# Patient Record
Sex: Male | Born: 1975 | Race: White | Hispanic: No | Marital: Married | Smoking: Former smoker
Health system: Southern US, Community
[De-identification: ages and names within clinical notes are randomized; demographics above are authoritative.]

## PROBLEM LIST (undated history)

## (undated) DIAGNOSIS — J45909 Unspecified asthma, uncomplicated: Secondary | ICD-10-CM

## (undated) DIAGNOSIS — G43909 Migraine, unspecified, not intractable, without status migrainosus: Secondary | ICD-10-CM

## (undated) DIAGNOSIS — J349 Unspecified disorder of nose and nasal sinuses: Secondary | ICD-10-CM

## (undated) HISTORY — DX: Migraine, unspecified, not intractable, without status migrainosus: G43.909

## (undated) HISTORY — PX: WRIST GANGLION EXCISION: SUR520

## (undated) HISTORY — DX: Unspecified asthma, uncomplicated: J45.909

## (undated) HISTORY — DX: Unspecified disorder of nose and nasal sinuses: J34.9

## (undated) HISTORY — PX: KNEE ARTHROSCOPY: SUR90

---

## 2008-06-30 ENCOUNTER — Emergency Department: Payer: Self-pay | Admitting: Unknown Physician Specialty

## 2009-06-25 ENCOUNTER — Ambulatory Visit: Payer: Self-pay | Admitting: Specialist

## 2011-05-19 ENCOUNTER — Ambulatory Visit: Payer: Self-pay | Admitting: Physical Medicine and Rehabilitation

## 2013-03-28 ENCOUNTER — Telehealth: Payer: Self-pay | Admitting: Internal Medicine

## 2013-03-28 NOTE — Telephone Encounter (Signed)
Pt called stating he was a bmp of your and wanted to switch back to you.  Your new pt appointment are in august and pt wanted to know if he could be worked in for sinus problems sooner

## 2013-03-28 NOTE — Telephone Encounter (Signed)
Yes, fine to work in slot.

## 2013-03-29 NOTE — Telephone Encounter (Signed)
Left message for pt to call office  Mailed new patient packet with appointment

## 2013-05-12 ENCOUNTER — Ambulatory Visit: Payer: Self-pay | Admitting: Internal Medicine

## 2013-08-15 ENCOUNTER — Ambulatory Visit: Payer: Self-pay | Admitting: Internal Medicine

## 2013-08-25 ENCOUNTER — Encounter: Payer: Self-pay | Admitting: Internal Medicine

## 2013-08-25 ENCOUNTER — Ambulatory Visit (INDEPENDENT_AMBULATORY_CARE_PROVIDER_SITE_OTHER): Payer: BC Managed Care – PPO | Admitting: Internal Medicine

## 2013-08-25 VITALS — BP 140/90 | HR 95 | Temp 98.5°F | Ht 71.0 in | Wt 205.0 lb

## 2013-08-25 DIAGNOSIS — Z0000 Encounter for general adult medical examination without abnormal findings: Secondary | ICD-10-CM | POA: Insufficient documentation

## 2013-08-25 DIAGNOSIS — A630 Anogenital (venereal) warts: Secondary | ICD-10-CM

## 2013-08-25 DIAGNOSIS — J309 Allergic rhinitis, unspecified: Secondary | ICD-10-CM

## 2013-08-25 MED ORDER — IMIQUIMOD 5 % EX CREA: TOPICAL_CREAM | CUTANEOUS | Status: DC

## 2013-08-25 MED ORDER — FLUTICASONE PROPIONATE 50 MCG/ACT NA SUSP: 2.0000 | Freq: Every day | NASAL | Status: AC

## 2013-08-25 NOTE — Progress Notes (Signed)
  Subjective:    Patient ID: Christian Santiago, male    DOB: [birth year], 37 y.o.   MRN: [id number]  HPI 37YO male with h/o seasonal allergies presents for annual exam and to re-establish care. He was last seen in our former office 2-3 years ago. Generally doing well. Working as a Copywriter, advertising. Went through a divorce, and learned his wife was cheating on him. Now has genital lesions he is concerned are genital warts. They are not painful. No urethral discharge or vesicular lesions.  Trying to follow healthy diet and exercise. Very active at work. Trying to quit smoking using e-cig.  Outpatient Encounter Prescriptions as of 08/25/2013  Medication Sig Dispense Refill  . loratadine (CLARITIN) 10 MG tablet Take 10 mg by mouth daily.      . fluticasone (FLONASE) 50 MCG/ACT nasal spray Place 2 sprays into the nose daily.  16 g  6   No facility-administered encounter medications on file as of 08/25/2013.    Review of Systems  Constitutional: Negative for fever, chills, activity change, appetite change, fatigue and unexpected weight change.  Eyes: Negative for visual disturbance.  Respiratory: Negative for cough and shortness of breath.   Cardiovascular: Negative for chest pain, palpitations and leg swelling.  Gastrointestinal: Negative for abdominal pain and abdominal distention.  Genitourinary: Positive for genital sores. Negative for dysuria, urgency and difficulty urinating.  Musculoskeletal: Negative for arthralgias and gait problem.  Skin: Negative for color change and rash.  Hematological: Negative for adenopathy.  Psychiatric/Behavioral: Negative for sleep disturbance and dysphoric mood. The patient is not nervous/anxious.        Objective:   Physical Exam  Constitutional: He is oriented to person, place, and time. He appears well-developed and well-nourished. No distress.  HENT:  Head: Normocephalic and atraumatic.  Right Ear: External ear normal.  Left Ear: External ear normal.  Nose: Nose  normal.  Mouth/Throat: Oropharynx is clear and moist. No oropharyngeal exudate.  Eyes: Conjunctivae and EOM are normal. Pupils are equal, round, and reactive to light. Right eye exhibits no discharge. Left eye exhibits no discharge. No scleral icterus.  Neck: Normal range of motion. Neck supple. No tracheal deviation present. No thyromegaly present.  Cardiovascular: Normal rate, regular rhythm and normal heart sounds.  Exam reveals no gallop and no friction rub.   No murmur heard. Pulmonary/Chest: Effort normal and breath sounds normal. No respiratory distress. He has no wheezes. He has no rales. He exhibits no tenderness.  Abdominal: Soft. Bowel sounds are normal. He exhibits no distension. There is no tenderness. There is no rebound.  Genitourinary:     Musculoskeletal: Normal range of motion. He exhibits no edema.  Lymphadenopathy:    He has no cervical adenopathy.  Neurological: He is alert and oriented to person, place, and time. No cranial nerve deficit. Coordination normal.  Skin: Skin is warm and dry. No rash noted. He is not diaphoretic. No erythema. No pallor.  Psychiatric: He has a normal mood and affect. His behavior is normal. Judgment and thought content normal.          Assessment & Plan:

## 2013-08-25 NOTE — Assessment & Plan Note (Signed)
General medical exam normal today. Encouraged continued efforts to quit smoking. Pt declines flu vaccine. Encouraged healthy diet and regular physical activity. Will check labs including CBC, CMP, lipids fasting next week. Recheck BP when pt comes for labs.

## 2013-08-25 NOTE — Assessment & Plan Note (Signed)
Recent genital warts. Will treat with Aldara topically 3x per week. We discussed that resolution may take 16 weeks or more. We also discussed potential treatment with laser therapy if Aldara does not work. Follow up prn.

## 2013-08-25 NOTE — Assessment & Plan Note (Signed)
Symptoms well controlled with Claritin and Flonase. Will continue.

## 2014-08-07 ENCOUNTER — Ambulatory Visit (INDEPENDENT_AMBULATORY_CARE_PROVIDER_SITE_OTHER): Payer: BC Managed Care – PPO | Admitting: Internal Medicine

## 2014-08-07 ENCOUNTER — Encounter: Payer: Self-pay | Admitting: Internal Medicine

## 2014-08-07 VITALS — BP 120/72 | HR 86 | Temp 98.0°F | Ht 71.0 in | Wt 196.2 lb

## 2014-08-07 DIAGNOSIS — L259 Unspecified contact dermatitis, unspecified cause: Secondary | ICD-10-CM

## 2014-08-07 HISTORY — DX: Unspecified contact dermatitis, unspecified cause: L25.9

## 2014-08-07 MED ORDER — TRIAMCINOLONE ACETONIDE 0.5 % EX OINT: 1.0000 "application " | TOPICAL_OINTMENT | Freq: Two times a day (BID) | CUTANEOUS | Status: DC

## 2014-08-07 MED ORDER — PREDNISONE 10 MG PO TABS: ORAL_TABLET | ORAL | Status: DC

## 2014-08-07 NOTE — Assessment & Plan Note (Signed)
Symptoms and exam c/w contact dermatitis from poison oak. Will start oral prednisone given widespread skin involvement and will use prn topical triamcinolone. Follow up prn.

## 2014-08-07 NOTE — Progress Notes (Signed)
   Subjective:    Patient ID: Christian Santiago, male    DOB: 28-Nov-1975, 38 y.o.   MRN: [id number]  HPI 38YO male presents for acute visit.  Rash developed on Friday. Using OTC topical treatments and benadryl with no improvement. Had been working out in the yard. Typical of previous exposure to poison oak. Rash is now spread over trunk, neck, arms and legs. No fever or chills.  Review of Systems  Constitutional: Negative for fever, chills, activity change, appetite change, fatigue and unexpected weight change.  Eyes: Negative for visual disturbance.  Respiratory: Negative for cough and shortness of breath.   Cardiovascular: Negative for chest pain, palpitations and leg swelling.  Gastrointestinal: Negative for abdominal pain and abdominal distention.  Genitourinary: Negative for dysuria, urgency and difficulty urinating.  Musculoskeletal: Negative for arthralgias and gait problem.  Skin: Positive for color change and rash.  Hematological: Negative for adenopathy.  Psychiatric/Behavioral: Positive for sleep disturbance. Negative for dysphoric mood. The patient is not nervous/anxious.        Objective:    BP 120/72  Pulse 86  Temp(Src) 98 F (36.7 C) (Oral)  Ht  (1.803 m)  Wt 196 lb 4 oz (89.018 kg)  BMI 27.38 kg/m2  SpO2 98% Physical Exam  Constitutional: He is oriented to person, place, and time. He appears well-developed and well-nourished. No distress.  HENT:  Head: Normocephalic.  Eyes: Conjunctivae and EOM are normal. Pupils are equal, round, and reactive to light.  Neck: Normal range of motion.  Pulmonary/Chest: Effort normal.  Musculoskeletal: Normal range of motion.  Neurological: He is alert and oriented to person, place, and time.  Skin: Skin is warm. Rash noted. Rash is vesicular (diffuse over neck, arms, legs, trunk). He is not diaphoretic. There is erythema.  Psychiatric: He has a normal mood and affect. His behavior is normal.          Assessment &  Plan:   Problem List Items Addressed This Visit     Unprioritized   Contact dermatitis - Primary     Symptoms and exam c/w contact dermatitis from poison oak. Will start oral prednisone given widespread skin involvement and will use prn topical triamcinolone. Follow up prn.    Relevant Medications      predniSONE (DELTASONE) tablet      triamcinolone (KENALOG) ointment 0.5%       Return if symptoms worsen or fail to improve.

## 2014-08-07 NOTE — Patient Instructions (Signed)
Start Prednisone taper.  Use Triamcinolone as needed.  Follow up as needed.

## 2014-08-07 NOTE — Progress Notes (Signed)
Pre visit review using our clinic review tool, if applicable. No additional management support is needed unless otherwise documented below in the visit note. 

## 2017-02-02 ENCOUNTER — Encounter: Payer: Self-pay | Admitting: Family

## 2017-04-09 ENCOUNTER — Encounter: Payer: Self-pay | Admitting: Family

## 2017-04-15 ENCOUNTER — Encounter: Payer: Self-pay | Admitting: Family

## 2017-04-22 ENCOUNTER — Ambulatory Visit (INDEPENDENT_AMBULATORY_CARE_PROVIDER_SITE_OTHER): Payer: BLUE CROSS/BLUE SHIELD | Admitting: Family

## 2017-04-22 ENCOUNTER — Encounter: Payer: Self-pay | Admitting: Family

## 2017-04-22 VITALS — BP 120/66 | HR 59 | Temp 98.2°F | Ht 71.0 in | Wt 185.4 lb

## 2017-04-22 DIAGNOSIS — Z0000 Encounter for general adult medical examination without abnormal findings: Secondary | ICD-10-CM

## 2017-04-22 NOTE — Assessment & Plan Note (Addendum)
Declines prostate exam in the absence of symptoms. Screening labs ordered. Referral to dermatology due to amount of time spent in the sun. Encouraged regular exercise. Advised tetanus vaccine at local pharmacy.

## 2017-04-22 NOTE — Progress Notes (Signed)
Pre visit review using our clinic review tool, if applicable. No additional management support is needed unless otherwise documented below in the visit note. 

## 2017-04-22 NOTE — Progress Notes (Signed)
Subjective:    Patient ID: Christian Santiago, male    DOB: July 16, 1976, 41 y.o.   MRN: [id number]  CC: Christian LlanoSam Vitale is a 41 y.o. male who presents today for physical exam.    HPI: Feeling well today. No complaints   Smoking-he is not interested in any medications to aid in quitting.      Colorectal  Cancer Screening: No early family history.  Prostate Cancer Screening: Has been discussed with patient; patient declined following PSA based on risks versus benefits. No prostate cancer family history.   Lung Cancer Screening: No 30 year pack year history and > 55 years.  Immunizations       Tetanus - due        Pneumococcal - declines HIV Screening- Candidate for; declines Labs: Screening labs today. Exercise: Gets regular exercise.  Alcohol use: On weekends.  Smoking/tobacco use: Nonsmoker.  Regular dental exams: UTD Wears seat belt: Yes. Skin: no h/o skin cancer;  A lot sun exposure  HISTORY:  Past Medical History:  Diagnosis Date  . Asthma   . Migraines   . Sinus disease    deviated septum    Past Surgical History:  Procedure Laterality Date  . KNEE ARTHROSCOPY     bilateral, torn meniscus and torn quad  . WRIST GANGLION EXCISION     Family History  Problem Relation Age of Onset  . Thyroid disease Mother   . Hypertension Father   . Prostate cancer Neg Hx       ALLERGIES: Patient has no known allergies.  Current Outpatient Prescriptions on File Prior to Visit  Medication Sig Dispense Refill  . fluticasone (FLONASE) 50 MCG/ACT nasal spray Place 2 sprays into the nose daily. 16 g 6   No current facility-administered medications on file prior to visit.     Social History  Substance Use Topics  . Smoking status: Light Tobacco Smoker  . Smokeless tobacco: Never Used  . Alcohol use Yes     Comment: wine on weekends    Review of Systems  Constitutional: Negative for chills and fever.  HENT: Negative for congestion.   Respiratory: Negative for cough.     Cardiovascular: Negative for chest pain, palpitations and leg swelling.  Gastrointestinal: Negative for diarrhea, nausea and vomiting.  Musculoskeletal: Negative for myalgias.  Skin: Negative for rash.  Neurological: Negative for headaches.  Hematological: Negative for adenopathy.  Psychiatric/Behavioral: Negative for confusion.      Objective:    BP 120/66   Pulse (!) 59   Temp 98.2 F (36.8 C) (Oral)   Ht 5\' 11"  (1.803 m)   Wt 185 lb 6.4 oz (84.1 kg)   SpO2 98%   BMI 25.86 kg/m   BP Readings from Last 3 Encounters:  04/22/17 120/66  08/07/14 120/72  08/25/13 140/90   Wt Readings from Last 3 Encounters:  04/22/17 185 lb 6.4 oz (84.1 kg)  08/07/14 196 lb 4 oz (89 kg)  08/25/13 205 lb (93 kg)    Physical Exam  Constitutional: He appears well-developed and well-nourished.  Neck: No thyroid mass and no thyromegaly present.  Cardiovascular: Regular rhythm and normal heart sounds.   Pulmonary/Chest: Effort normal and breath sounds normal. No respiratory distress. He has no wheezes. He has no rhonchi. He has no rales.  Lymphadenopathy:       Head (right side): No submental, no submandibular, no tonsillar, no preauricular, no posterior auricular and no occipital adenopathy present.       Head (left  side): No submental, no submandibular, no tonsillar, no preauricular, no posterior auricular and no occipital adenopathy present.    He has no cervical adenopathy.    He has no axillary adenopathy.  Neurological: He is alert.  Skin: Skin is warm and dry.  Psychiatric: He has a normal mood and affect. His speech is normal and behavior is normal.  Vitals reviewed.      Assessment & Plan:   Problem List Items Addressed This Visit      Other   Routine general medical examination at a health care facility - Primary    Declines prostate exam in the absence of symptoms. Screening labs ordered. Referral to dermatology due to amount of time spent in the sun. Encouraged regular  exercise. Advised tetanus vaccine at local pharmacy.      Relevant Orders   CBC with Differential/Platelet   Comprehensive metabolic panel   Hemoglobin A1c   Lipid panel   TSH   VITAMIN D 25 Hydroxy (Vit-D Deficiency, Fractures)   Ambulatory referral to Dermatology       I have discontinued Mr. Neer's loratadine, imiquimod, predniSONE, and triamcinolone ointment. I am also having him maintain his fluticasone.   No orders of the defined types were placed in this encounter.   Return precautions given.   Risks, benefits, and alternatives of the medications and treatment plan prescribed today were discussed, and patient expressed understanding.   Education regarding symptom management and diagnosis given to patient on AVS.   Continue to follow with Allegra Grana, FNP for routine health maintenance.   Yahmir Saputo and I agreed with plan.   Rennie Plowman, FNP

## 2017-04-22 NOTE — Patient Instructions (Addendum)
Pleasure meeting you Fasting lab appointment Referral to dermatology   Health Maintenance, Male A healthy lifestyle and preventive care is important for your health and wellness. Ask your health care provider about what schedule of regular examinations is right for you. What should I know about weight and diet?  Eat a Healthy Diet  Eat plenty of vegetables, fruits, whole grains, low-fat dairy products, and lean protein.  Do not eat a lot of foods high in solid fats, added sugars, or salt. Maintain a Healthy Weight  Regular exercise can help you achieve or maintain a healthy weight. You should:  Do at least 150 minutes of exercise each week. The exercise should increase your heart rate and make you sweat (moderate-intensity exercise).  Do strength-training exercises at least twice a week. Watch Your Levels of Cholesterol and Blood Lipids  Have your blood tested for lipids and cholesterol every 5 years starting at 43 years of age. If you are at high risk for heart disease, you should start having your blood tested when you are 43 years old. You may need to have your cholesterol levels checked more often if:  Your lipid or cholesterol levels are high.  You are older than 43 years of age.  You are at high risk for heart disease. What should I know about cancer screening? Many types of cancers can be detected early and may often be prevented. Lung Cancer  You should be screened every year for lung cancer if:  You are a current smoker who has smoked for at least 30 years.  You are a former smoker who has quit within the past 15 years.  Talk to your health care provider about your screening options, when you should start screening, and how often you should be screened. Colorectal Cancer  Routine colorectal cancer screening usually begins at 43 years of age and should be repeated every 5-10 years until you are 43 years old. You may need to be screened more often if early forms of  precancerous polyps or small growths are found. Your health care provider may recommend screening at an earlier age if you have risk factors for colon cancer.  Your health care provider may recommend using home test kits to check for hidden blood in the stool.  A small camera at the end of a tube can be used to examine your colon (sigmoidoscopy or colonoscopy). This checks for the earliest forms of colorectal cancer. Prostate and Testicular Cancer  Depending on your age and overall health, your health care provider may do certain tests to screen for prostate and testicular cancer.  Talk to your health care provider about any symptoms or concerns you have about testicular or prostate cancer. Skin Cancer  Check your skin from head to toe regularly.  Tell your health care provider about any new moles or changes in moles, especially if:  There is a change in a mole's size, shape, or color.  You have a mole that is larger than a pencil eraser.  Always use sunscreen. Apply sunscreen liberally and repeat throughout the day.  Protect yourself by wearing long sleeves, pants, a wide-brimmed hat, and sunglasses when outside. What should I know about heart disease, diabetes, and high blood pressure?  If you are 5618-43 years of age, have your blood pressure checked every 3-5 years. If you are 43 years of age or older, have your blood pressure checked every year. You should have your blood pressure measured twice-once when you are at a  hospital or clinic, and once when you are not at a hospital or clinic. Record the average of the two measurements. To check your blood pressure when you are not at a hospital or clinic, you can use:  An automated blood pressure machine at a pharmacy.  A home blood pressure monitor.  Talk to your health care provider about your target blood pressure.  If you are between 63-51 years old, ask your health care provider if you should take aspirin to prevent heart  disease.  Have regular diabetes screenings by checking your fasting blood sugar level.  If you are at a normal weight and have a low risk for diabetes, have this test once every three years after the age of 45.  If you are overweight and have a high risk for diabetes, consider being tested at a younger age or more often.  A one-time screening for abdominal aortic aneurysm (AAA) by ultrasound is recommended for men aged 65-75 years who are current or former smokers. What should I know about preventing infection? Hepatitis B  If you have a higher risk for hepatitis B, you should be screened for this virus. Talk with your health care provider to find out if you are at risk for hepatitis B infection. Hepatitis C  Blood testing is recommended for:  Everyone born from [birth year] through 1967.  Anyone with known risk factors for hepatitis C. Sexually Transmitted Diseases (STDs)  You should be screened each year for STDs including gonorrhea and chlamydia if:  You are sexually active and are younger than 43 years of age.  You are older than 43 years of age and your health care provider tells you that you are at risk for this type of infection.  Your sexual activity has changed since you were last screened and you are at an increased risk for chlamydia or gonorrhea. Ask your health care provider if you are at risk.  Talk with your health care provider about whether you are at high risk of being infected with HIV. Your health care provider may recommend a prescription medicine to help prevent HIV infection. What else can I do?  Schedule regular health, dental, and eye exams.  Stay current with your vaccines (immunizations).  Do not use any tobacco products, such as cigarettes, chewing tobacco, and e-cigarettes. If you need help quitting, ask your health care provider.  Limit alcohol intake to no more than 2 drinks per day. One drink equals 12 ounces of beer, 5 ounces of wine, or 1 ounces of hard  liquor.  Do not use street drugs.  Do not share needles.  Ask your health care provider for help if you need support or information about quitting drugs.  Tell your health care provider if you often feel depressed.  Tell your health care provider if you have ever been abused or do not feel safe at home. This information is not intended to replace advice given to you by your health care provider. Make sure you discuss any questions you have with your health care provider. Document Released: 05/08/2008 Document Revised: 07/09/2016 Document Reviewed: 08/14/2015 Elsevier Interactive Patient Education  2017 [employer].

## 2017-04-23 ENCOUNTER — Encounter: Payer: Self-pay | Admitting: Family

## 2017-12-28 ENCOUNTER — Ambulatory Visit: Payer: BLUE CROSS/BLUE SHIELD | Admitting: Family Medicine

## 2017-12-28 ENCOUNTER — Encounter: Payer: Self-pay | Admitting: Family Medicine

## 2017-12-28 ENCOUNTER — Other Ambulatory Visit: Payer: Self-pay

## 2017-12-28 VITALS — BP 118/76 | HR 86 | Temp 98.0°F | Wt 203.0 lb

## 2017-12-28 DIAGNOSIS — K529 Noninfective gastroenteritis and colitis, unspecified: Secondary | ICD-10-CM | POA: Insufficient documentation

## 2017-12-28 HISTORY — DX: Noninfective gastroenteritis and colitis, unspecified: K52.9

## 2017-12-28 LAB — COMPREHENSIVE METABOLIC PANEL
ALBUMIN: 4.5 g/dL (ref 3.5–5.2)
ALK PHOS: 69 U/L (ref 39–117)
ALT: 27 U/L (ref 0–53)
AST: 22 U/L (ref 0–37)
BUN: 19 mg/dL (ref 6–23)
CHLORIDE: 103 meq/L (ref 96–112)
CO2: 27 mEq/L (ref 19–32)
CREATININE: 0.87 mg/dL (ref 0.40–1.50)
Calcium: 9.4 mg/dL (ref 8.4–10.5)
GFR: 101.66 mL/min (ref >60.00)
GLUCOSE: 108 mg/dL — AB (ref 70–99)
Potassium: 4.9 mEq/L (ref 3.5–5.1)
SODIUM: 137 meq/L (ref 135–145)
TOTAL PROTEIN: 7.7 g/dL (ref 6.0–8.3)
Total Bilirubin: 0.4 mg/dL (ref 0.2–1.2)

## 2017-12-28 NOTE — Assessment & Plan Note (Signed)
Differential includes gallbladder pathology, gastric pathology, infectious diarrhea, IBS, and food related issues.  Will check stool studies given the length of time this has been going on.  Check CMP to evaluate renal function and electrolytes as well as alkaline phosphatase and LFTs.  Given chronicity we will go ahead and refer to GI.  I discussed the fodmap diet with him as well.  He will cut things out from this diet and see if his symptoms improve.  Return precautions and AVS.

## 2017-12-28 NOTE — Progress Notes (Signed)
  Tommi Rumps, MD Phone: [phone]  Christian Santiago is a 42 y.o. male who presents today for same-day visit.  Patient notes for the last several years he has had issues with abdominal cramping and passing gas 5 minutes after eating his meals.  He will get some right upper quadrant discomfort after eating his meals.  He notes loose stools intermittently with diarrhea at times.  1-2 bowel movements a day.  No blood in stool.  No nausea or vomiting.  Occasionally does have some heartburn that goes away when he eats.  He does note he is lactose intolerant.  No specific foods cause this.  Not drinking out of lakes or streams.  No belching.  No fevers.  Drinks 6-7 beers a week.  No history of colon issues.  No family history of colon issues.  Social History   Tobacco Use  Smoking Status Light Tobacco Smoker  Smokeless Tobacco Never Used     ROS see history of present illness  Objective  Physical Exam Vitals:   12/28/17 0920  BP: 118/76  Pulse: 86  Temp: 98 F (36.7 C)  SpO2: 98%    BP Readings from Last 3 Encounters:  12/28/17 118/76  04/22/17 120/66  08/07/14 120/72   Wt Readings from Last 3 Encounters:  12/28/17 203 lb (92.1 kg)  04/22/17 185 lb 6.4 oz (84.1 kg)  08/07/14 196 lb 4 oz (89 kg)    Physical Exam  Constitutional: No distress.  Cardiovascular: Normal rate, regular rhythm and normal heart sounds.  Pulmonary/Chest: Effort normal and breath sounds normal.  Abdominal: Soft. Bowel sounds are normal. He exhibits no distension. There is no tenderness. There is no rebound and no guarding.  Musculoskeletal: He exhibits no edema.  Neurological: He is alert. Gait normal.  Skin: Skin is warm and dry. He is not diaphoretic.     Assessment/Plan: Please see individual problem list.  Chronic diarrhea Differential includes gallbladder pathology, gastric pathology, infectious diarrhea, IBS, and food related issues.  Will check stool studies given the length of time  this has been going on.  Check CMP to evaluate renal function and electrolytes as well as alkaline phosphatase and LFTs.  Given chronicity we will go ahead and refer to GI.  I discussed the fodmap diet with him as well.  He will cut things out from this diet and see if his symptoms improve.  Return precautions and AVS.   Orders Placed This Encounter  Procedures  . Stool Culture    Standing Status:   Future    Standing Expiration Date:   12/28/2018  . Ova and parasite examination    Standing Status:   Future    Standing Expiration Date:   12/28/2018  . C. difficile GDH and Toxin A/B    Standing Status:   Future    Standing Expiration Date:   12/28/2018  . Comp Met (CMET)  . Ambulatory referral to Gastroenterology    Referral Priority:   Routine    Referral Type:   Consultation    Referral Reason:   Specialty Services Required    Number of Visits Requested:   1    No orders of the defined types were placed in this encounter.    Tommi Rumps, MD [place]

## 2017-12-28 NOTE — Patient Instructions (Signed)
Nice to see you. We will check some stool studies and lab work. Please monitor your symptoms and if you develop worsening abdominal pain or blood in your stool please be reevaluated.

## 2018-01-27 ENCOUNTER — Encounter: Payer: Self-pay | Admitting: Gastroenterology

## 2018-01-27 ENCOUNTER — Ambulatory Visit (INDEPENDENT_AMBULATORY_CARE_PROVIDER_SITE_OTHER): Payer: BLUE CROSS/BLUE SHIELD | Admitting: Gastroenterology

## 2018-01-27 ENCOUNTER — Encounter (INDEPENDENT_AMBULATORY_CARE_PROVIDER_SITE_OTHER): Payer: Self-pay

## 2018-01-27 VITALS — BP 143/85 | HR 81 | Temp 97.8°F | Ht 69.0 in | Wt 202.6 lb

## 2018-01-27 DIAGNOSIS — R140 Abdominal distension (gaseous): Secondary | ICD-10-CM | POA: Diagnosis not present

## 2018-01-27 NOTE — Progress Notes (Signed)
Wyline Mood MD, MRCP(U.K) [address]  [address]  [place], [place] [address]  Main: [phone]  Fax: [phone]   Gastroenterology Consultation  Referring Provider:     Glori Luis, MD Primary Care Physician:  Allegra Grana, FNP Primary Gastroenterologist:  Dr. Wyline Mood  Reason for Consultation:     Diarrhea         HPI:   Christian Santiago is a 42 y.o. y/o male referred for consultation & management  by Dr. Jason Coop, Lyn Records, FNP.     He has been referred for chronic diarrhea.   Labs 12/2017 - CMP-normal except elevated glucose .    He says that he runs between soft and hard stools. It began  atleast a year back and worse last 6 months. He has a bowel movement every morning at 730 am , most days softer side. Consistency of "chunky salsa", foul smelling . No blood in it, lot of gas, RUQ pain when he has a lot of gas and bloating . No burping. When he passes gas feels better with the pain. Denies a "stomach bug " recently. Days when he has more gas is associated with softer stools. Gurgling and rumbling noises. He has lactose intolerance. Gets cramps and diarrhea. Beans "tear his stomach up ". Sometimes feels better after a good bowel movement , usually the pain is better after a bowel movement    Denies any weight loss, rather gained weight . Denies any blood in the stool.   Gatorade - consumes 3 bottles a day , with lunch has a soda, regular. No crystallite. Sweet tea - occasionally, no candy . Every morning has pecan rolls x2 . Mostly fast food for lunch, dinner has some food at home or outside food. Consumes cereal honey bunch , uses lactaid milk . No artificial sweeteners.   Past Medical History:  Diagnosis Date  . Asthma   . Migraines   . Sinus disease    deviated septum    Past Surgical History:  Procedure Laterality Date  . KNEE ARTHROSCOPY     bilateral, torn meniscus and torn quad  . WRIST GANGLION EXCISION      Prior to Admission  medications   Medication Sig Start Date End Date Taking? Authorizing Provider  fluticasone (FLONASE) 50 MCG/ACT nasal spray Place 2 sprays into the nose daily. 08/25/13   Shelia Media, MD    Family History  Problem Relation Age of Onset  . Thyroid disease Mother   . Hypertension Father   . Prostate cancer Neg Hx      Social History   Tobacco Use  . Smoking status: Light Tobacco Smoker  . Smokeless tobacco: Never Used  Substance Use Topics  . Alcohol use: Yes    Comment: wine on weekends  . Drug use: No    Allergies as of 01/27/2018  . (No Known Allergies)    Review of Systems:    All systems reviewed and negative except where noted in HPI.   Physical Exam:  There were no vitals taken for this visit. No LMP for male patient. Psych:  Alert and cooperative. Normal mood and affect. General:   Alert,  Well-developed, well-nourished, pleasant and cooperative in NAD Head:  Normocephalic and atraumatic. Eyes:  Sclera clear, no icterus.   Conjunctiva pink. Ears:  Normal auditory acuity. Nose:  No deformity, discharge, or lesions. Mouth:  No deformity or lesions,oropharynx pink & moist. Neck:  Supple; no masses or thyromegaly. Lungs:  Respirations even and unlabored.  Clear throughout to auscultation.   No wheezes, crackles, or rhonchi. No acute distress. Heart:  Regular rate and rhythm; no murmurs, clicks, rubs, or gallops. Abdomen:  Normal bowel sounds.  No bruits.  Soft, non-tender and non-distended without masses, hepatosplenomegaly or hernias noted.  No guarding or rebound tenderness.    Msk:  Symmetrical without gross deformities. Good, equal movement & strength bilaterally. Pulses:  Normal pulses noted. Extremities:  No clubbing or edema.  No cyanosis. Neurologic:  Alert and oriented x3;  grossly normal neurologically. Skin:  Intact without significant lesions or rashes. No jaundice. Lymph Nodes:  No significant cervical adenopathy. Psych:  Alert and cooperative.  Normal mood and affect.  Imaging Studies: No results found.  Assessment and Plan:   Christian Santiago is a 42 y.o. y/o male has been referred for diarrhea. His symptoms seem to be more suggestive of bloating , gas from either IBS-D or SIBO. Diet rich in high fructose products and may be causing relative fructose intolerance.    Plan  1. LOW FODMAP diet , maintain food diary  2. If no better in 2 weeks trial of antibiotics 3. Check CBC and celiac serology 4. IF no better then get EGD+colonoscopy    Follow up in 4 weeks   Dr Wyline MoodKiran Costella Schwarz MD,MRCP(U.K)

## 2018-03-03 ENCOUNTER — Encounter (INDEPENDENT_AMBULATORY_CARE_PROVIDER_SITE_OTHER): Payer: Self-pay

## 2018-03-03 ENCOUNTER — Ambulatory Visit (INDEPENDENT_AMBULATORY_CARE_PROVIDER_SITE_OTHER): Payer: BLUE CROSS/BLUE SHIELD | Admitting: Gastroenterology

## 2018-03-03 ENCOUNTER — Encounter: Payer: Self-pay | Admitting: Gastroenterology

## 2018-03-03 VITALS — BP 154/90 | HR 101 | Temp 98.1°F | Resp 16 | Ht 69.0 in | Wt 195.0 lb

## 2018-03-03 DIAGNOSIS — R140 Abdominal distension (gaseous): Secondary | ICD-10-CM | POA: Diagnosis not present

## 2018-03-03 NOTE — Progress Notes (Signed)
Wyline Mood MD, MRCP(U.K) [address]  [address]  [place], [place] [address]  Main: [phone]  Fax: [phone]   Primary Care Physician: Allegra Grana, FNP  Primary Gastroenterologist:  Dr. Wyline Mood   Chief Complaint  Patient presents with  . Follow-up    HPI: Christian Santiago is a 42 y.o. male   Summary of history :  He was initially seen on 01/27/18 for diarrhea. History of shifting between soft and hard stools. It began  atleast a year back and worse last 6 months. He has a bowel movement every morning at 730 am , most days softer side. Consistency of "chunky salsa", foul smelling . No blood in it, lot of gas, RUQ pain when he has a lot of gas and bloating . No burping. When he passes gas feels better with the pain. Denies a "stomach bug " recently. Days when he has more gas is associated with softer stools. Gurgling and rumbling noises. He has lactose intolerance. Gets cramps and diarrhea. Beans "tear his stomach up ". Sometimes feels better after a good bowel movement , usually the pain is better after a bowel movement .   Gatorade - consumes 3 bottles a day , with lunch has a soda, regular. No crystallite. Sweet tea - occasionally, no candy . Every morning has pecan rolls x2 . Mostly fast food for lunch, dinner has some food at home or outside food. Consumes cereal honey bunch , uses lactaid milk . No artificial sweeteners.   Interval history   01/27/2018-  03/03/2018  Lot better since the last visit, cut down all the sodas, cut down on high fructose corn syrup, no gas no bloating , bowel movements better , formed.      Current Outpatient Medications  Medication Sig Dispense Refill  . fluticasone (FLONASE) 50 MCG/ACT nasal spray Place 2 sprays into the nose daily. 16 g 6   No current facility-administered medications for this visit.     Allergies as of 03/03/2018  . (No Known Allergies)    ROS:  General: Negative for anorexia, weight loss, fever,  chills, fatigue, weakness. ENT: Negative for hoarseness, difficulty swallowing , nasal congestion. CV: Negative for chest pain, angina, palpitations, dyspnea on exertion, peripheral edema.  Respiratory: Negative for dyspnea at rest, dyspnea on exertion, cough, sputum, wheezing.  GI: See history of present illness. GU:  Negative for dysuria, hematuria, urinary incontinence, urinary frequency, nocturnal urination.  Endo: Negative for unusual weight change.    Physical Examination:   BP (!) 154/90   Pulse (!) 101   Temp 98.1 F (36.7 C) (Oral)   Resp 16   Ht 5\' 9"  (1.753 m)   Wt 195 lb (88.5 kg)   SpO2 98%   BMI 28.80 kg/m   General: Well-nourished, well-developed in no acute distress.  Eyes: No icterus. Conjunctivae pink. Mouth: Oropharyngeal mucosa moist and pink , no lesions erythema or exudate. Lungs: Clear to auscultation bilaterally. Non-labored. Heart: Regular rate and rhythm, no murmurs rubs or gallops.  Abdomen: Bowel sounds are normal, nontender, nondistended, no hepatosplenomegaly or masses, no abdominal bruits or hernia , no rebound or guarding.   Extremities: No lower extremity edema. No clubbing or deformities. Neuro: Alert and oriented x 3.  Grossly intact. Skin: Warm and dry, no jaundice.   Psych: Alert and cooperative, normal mood and affect.   Imaging Studies: No results found.  Assessment and Plan:   Christian Santiago is a 42 y.o. y/o male here to  follow  for diarrhea. His symptoms seem to be more suggestive of bloating , gas from consumption of large qty of fructose in diet. All symptoms have resolved after he cut off fructose in the diet . If symptoms return will proceed with EGD+colonoscopy   Dr Wyline MoodKiran Bessye Stith  MD,MRCP [facility](U.K) Follow up PRN

## 2018-09-30 ENCOUNTER — Emergency Department
Admission: EM | Admit: 2018-09-30 | Discharge: 2018-09-30 | Disposition: A | Discharge status: Home / Self Care | Payer: BLUE CROSS/BLUE SHIELD | Attending: Emergency Medicine | Admitting: Emergency Medicine

## 2018-09-30 ENCOUNTER — Encounter: Payer: Self-pay | Admitting: Emergency Medicine

## 2018-09-30 ENCOUNTER — Emergency Department: Payer: BLUE CROSS/BLUE SHIELD

## 2018-09-30 DIAGNOSIS — F17210 Nicotine dependence, cigarettes, uncomplicated: Secondary | ICD-10-CM | POA: Diagnosis not present

## 2018-09-30 DIAGNOSIS — J36 Peritonsillar abscess: Secondary | ICD-10-CM | POA: Insufficient documentation

## 2018-09-30 DIAGNOSIS — J45909 Unspecified asthma, uncomplicated: Secondary | ICD-10-CM | POA: Insufficient documentation

## 2018-09-30 DIAGNOSIS — J029 Acute pharyngitis, unspecified: Secondary | ICD-10-CM | POA: Diagnosis present

## 2018-09-30 LAB — CBC
HCT: 46.7 % (ref 39.0–52.0)
Hemoglobin: 15.4 g/dL (ref 13.0–17.0)
MCH: 30.7 pg (ref 26.0–34.0)
MCHC: 33 g/dL (ref 30.0–36.0)
MCV: 93 fL (ref 80.0–100.0)
PLATELETS: 259 10*3/uL (ref 150–400)
RBC: 5.02 MIL/uL (ref 4.22–5.81)
RDW: 12.4 % (ref 11.5–15.5)
WBC: 9.8 10*3/uL (ref 4.0–10.5)
nRBC: 0 % (ref 0.0–0.2)

## 2018-09-30 LAB — COMPREHENSIVE METABOLIC PANEL
ALT: 20 U/L (ref 0–44)
ANION GAP: 12 (ref 5–15)
AST: 19 U/L (ref 15–41)
Albumin: 4.5 g/dL (ref 3.5–5.0)
Alkaline Phosphatase: 78 U/L (ref 38–126)
BUN: 21 mg/dL — ABNORMAL HIGH (ref 6–20)
CHLORIDE: 102 mmol/L (ref 98–111)
CO2: 27 mmol/L (ref 22–32)
CREATININE: 0.72 mg/dL (ref 0.61–1.24)
Calcium: 9 mg/dL (ref 8.9–10.3)
Glucose, Bld: 111 mg/dL — ABNORMAL HIGH (ref 70–99)
POTASSIUM: 3.8 mmol/L (ref 3.5–5.1)
Sodium: 141 mmol/L (ref 135–145)
Total Bilirubin: 1.1 mg/dL (ref 0.3–1.2)
Total Protein: 8.4 g/dL — ABNORMAL HIGH (ref 6.5–8.1)

## 2018-09-30 MED ORDER — AMOXICILLIN-POT CLAVULANATE 875-125 MG PO TABS: 1.0000 | ORAL_TABLET | Freq: Two times a day (BID) | ORAL | 0 refills | Status: AC

## 2018-09-30 MED ORDER — DEXAMETHASONE SODIUM PHOSPHATE 10 MG/ML IJ SOLN
10.0000 mg | Freq: Once | INTRAMUSCULAR | Status: AC
  Administered 2018-09-30: 10 mg via INTRAVENOUS
  Filled 2018-09-30: qty 1

## 2018-09-30 MED ORDER — CEFTRIAXONE SODIUM 1 G IJ SOLR
1.0000 g | Freq: Once | INTRAMUSCULAR | Status: AC
  Administered 2018-09-30: 1 g via INTRAVENOUS
  Filled 2018-09-30: qty 10

## 2018-09-30 MED ORDER — IOHEXOL 300 MG/ML  SOLN
75.0000 mL | Freq: Once | INTRAMUSCULAR | Status: AC | PRN
  Administered 2018-09-30: 75 mL via INTRAVENOUS

## 2018-09-30 MED ORDER — PREDNISONE 20 MG PO TABS: 60.0000 mg | ORAL_TABLET | Freq: Every day | ORAL | 0 refills | Status: AC

## 2018-09-30 NOTE — ED Notes (Signed)
Pt with neck swelling on right throat. Pt states was seen at [facility] urgent care and given zpack but swelling has gotten worse since then. PT states hx of peritonsillar abscess.

## 2018-09-30 NOTE — ED Triage Notes (Signed)
Pt reports Sunday started with a sore throat, was seen at [facility] on Tuesday and was given steroids, antibiotics and a nasal spray. Pt reports not getting better but is getting worse. Pt reports this has happened in the past and was an abscess.

## 2018-09-30 NOTE — ED Provider Notes (Signed)
[facility] Emergency Department Provider Note ____________________________   First MD Initiated Contact with Patient 09/30/18 0448     (approximate)  I have reviewed the triage vital signs and the nursing notes.   HISTORY  Chief Complaint Oral Swelling and Sore Throat    HPI Christian Santiago is a 42 y.o. male presents to the emergency department with worsening sore throat.  Patient states that he was seen at [facility] urgent care on Tuesday at which time he received a "shot of steroids azithromycin and nasal spray.  Patient states that despite these interventions his symptoms have worsened.  Patient does admit to having a tonsillar abscess in the past that required I&D by Dr. Willeen Cass.  Patient states that symptoms today are consistent with when he had a tonsillar abscess.  Patient afebrile on presentation with temperature 97.6.   Past Medical History:  Diagnosis Date  . Asthma   . Migraines   . Sinus disease    deviated septum    Patient Active Problem List   Diagnosis Date Noted  . Chronic diarrhea 12/28/2017  . Contact dermatitis 08/07/2014  . Routine general medical examination at a health care facility 08/25/2013  . Genital warts 08/25/2013  . Allergic rhinitis 08/25/2013    Past Surgical History:  Procedure Laterality Date  . KNEE ARTHROSCOPY     bilateral, torn meniscus and torn quad  . WRIST GANGLION EXCISION      Prior to Admission medications   Medication Sig Start Date End Date Taking? Authorizing Provider  amoxicillin-clavulanate (AUGMENTIN) 875-125 MG tablet Take 1 tablet by mouth 2 (two) times daily for 10 days. 09/30/18 10/10/18  Darci Current, MD  azithromycin (ZITHROMAX) 250 MG tablet Take 1-2 tablets by mouth as directed. Take 2 tablets on day 1, then 1 tablet daily on days 2-5 09/28/18   [provider]  fluticasone (FLONASE) 50 MCG/ACT nasal spray Place 2 sprays into the nose daily. Patient not taking: Reported on  09/30/2018 08/25/13   Shelia Media, MD  ipratropium (ATROVENT) 0.06 % nasal spray Place 2 sprays into both nostrils 3 (three) times daily. 09/28/18   [provider]  predniSONE (DELTASONE) 20 MG tablet Take 3 tablets (60 mg total) by mouth daily for 5 days. 09/30/18 10/05/18  Darci Current, MD    Allergies No known drug allergies  Family History  Problem Relation Age of Onset  . Thyroid disease Mother   . Hypertension Father   . Prostate cancer Neg Hx     Social History Social History   Tobacco Use  . Smoking status: Light Tobacco Smoker    Types: Cigarettes  . Smokeless tobacco: Never Used  Substance Use Topics  . Alcohol use: Yes    Comment: wine on weekends  . Drug use: No    Review of Systems Constitutional: No fever/chills Eyes: No visual changes. ENT: Positive for sore throat Cardiovascular: Denies chest pain. Respiratory: Denies shortness of breath. Gastrointestinal: No abdominal pain.  No nausea, no vomiting.  No diarrhea.  No constipation. Genitourinary: Negative for dysuria. Musculoskeletal: Negative for neck pain.  Negative for back pain. Integumentary: Negative for rash. Neurological: Negative for headaches, focal weakness or numbness.  ____________________________________________   PHYSICAL EXAM:  VITAL SIGNS: ED Triage Vitals  Enc Vitals Group     BP 09/30/18 0431 135/78     Pulse Rate 09/30/18 0431 98     Resp 09/30/18 0431 20     Temp 09/30/18 0431 97.6 F (  36.4 C)     Temp Source 09/30/18 0431 Oral     SpO2 09/30/18 0431 100 %     Weight 09/30/18 0432 86.6 kg (191 lb)     Height 09/30/18 0432 1.803 m (5\' 11" )     Head Circumference --      Peak Flow --      Pain Score 09/30/18 0432 6     Pain Loc --      Pain Edu? --      Excl. in GC? --     Constitutional: Alert and oriented. Well appearing and in no acute distress. Eyes: Conjunctivae are normal.  Head: Atraumatic. Mouth/Throat: Mucous membranes are moist.   Pharyngeal erythema with fullness of the right tonsil.  Neck: No stridor.  Positive anterior cervical lymphadenopathy Cardiovascular: Normal rate, regular rhythm. Good peripheral circulation. Grossly normal heart sounds. Respiratory: Normal respiratory effort.  No retractions. Lungs CTAB. Gastrointestinal: Soft and nontender. No distention.  Musculoskeletal: No lower extremity tenderness nor edema. No gross deformities of extremities. Neurologic:  Normal speech and language. No gross focal neurologic deficits are appreciated.  Skin:  Skin is warm, dry and intact. No rash noted. Psychiatric: Mood and affect are normal. Speech and behavior are normal.  ____________________________________________   LABS (all labs ordered are listed, but only abnormal results are displayed)  Labs Reviewed  COMPREHENSIVE METABOLIC PANEL - Abnormal; Notable for the following components:      Result Value   Glucose, Bld 111 (*)    BUN 21 (*)    Total Protein 8.4 (*)    All other components within normal limits  CBC   ________  RADIOLOGY I, [place] N Pharoah Goggins, personally viewed and evaluated these images (plain radiographs) as part of my medical decision making, as well as reviewing the written report by the radiologist.  ED MD interpretation: 23 x 11 mm peritonsillar right palatine abscess.  Official radiology report(s): Ct Soft Tissue Neck W Contrast  Result Date: 09/30/2018 CLINICAL DATA:  Right-sided swelling and pharyngeal pain. EXAM: CT NECK WITH CONTRAST TECHNIQUE: Multidetector CT imaging of the neck was performed using the standard protocol following the bolus administration of intravenous contrast. CONTRAST:  75mL OMNIPAQUE IOHEXOL 300 MG/ML  SOLN COMPARISON:  None. FINDINGS: PHARYNX AND LARYNX: --Nasopharynx: Fossae of [place]muller are clear. Normal adenoid tonsils for age. --Oral cavity and oropharynx: Right palatine peritonsillar abscess measures 2.3 x 1.1 cm. --Hypopharynx: There is edema that  extends into the right vallecula. --Larynx: Normal epiglottis and pre-epiglottic space. Normal aryepiglottic and vocal folds. --Retropharyngeal space: No abscess, effusion or lymphadenopathy. SALIVARY GLANDS: --Parotid: No mass lesion or inflammation. No sialolithiasis or ductal dilatation. --Submandibular: Symmetric without inflammation. No sialolithiasis or ductal dilatation. --Sublingual: Normal. No ranula or other visible lesion of the base of tongue and floor of mouth. THYROID: Normal. LYMPH NODES: Right level 2A nodes measure up to 12 mm. Left level 2A nodes measure 11 mm. VASCULAR: Major cervical vessels are patent. LIMITED INTRACRANIAL: Normal. VISUALIZED ORBITS: Normal. MASTOIDS AND VISUALIZED PARANASAL SINUSES: No fluid levels or advanced mucosal thickening. No mastoid effusion. SKELETON: No bony spinal canal stenosis. No lytic or blastic lesions. UPPER CHEST: Clear. OTHER: None. IMPRESSION: 1. Right palatine peritonsillar abscess measuring 23 x 11 mm with edema that extends to the right vallecula. 2. No retropharyngeal abscess or fluid collection. 3. Reactive bilateral level 2A cervical lymph nodes. Electronically Signed   By: Deatra Robinson M.D.   On: 09/30/2018 06:27     Procedures   ____________________________________________  INITIAL IMPRESSION / ASSESSMENT AND PLAN / ED COURSE  As part of my medical decision making, I reviewed the following data within the electronic MEDICAL RECORD NUMBER   42 year old male presenting with above-stated history and physical exam secondary to sore throat.  Concern for possible peritonsillar abscess and as such a CT scan was performed which revealed a 23 x 11 mm right peritonsillar abscess.  Patient was given IV ceftriaxone 1 g in the emergency department.  Patient was also given Decadron 10 mg.  Patient discussed with Dr. Willeen Cass who agreed with plan for Augmentin and prednisone for home with outpatient  follow-up. ____________________________________________  FINAL CLINICAL IMPRESSION(S) / ED DIAGNOSES  Final diagnoses:  Tonsillar abscess     MEDICATIONS GIVEN DURING THIS VISIT:  Medications  dexamethasone (DECADRON) injection 10 mg (has no administration in time range)  cefTRIAXone (ROCEPHIN) 1 g in sodium chloride 0.9 % 100 mL IVPB (0 g Intravenous Stopped 09/30/18 0558)  iohexol (OMNIPAQUE) 300 MG/ML solution 75 mL (75 mLs Intravenous Contrast Given 09/30/18 0547)     ED Discharge Orders         Ordered    amoxicillin-clavulanate (AUGMENTIN) 875-125 MG tablet  2 times daily     09/30/18 0648    predniSONE (DELTASONE) 20 MG tablet  Daily     09/30/18 0648           Note:  This document was prepared using Dragon voice recognition software and may include unintentional dictation errors.    Darci Current, MD 09/30/18 [phone]

## 2020-02-15 IMAGING — CT CT NECK W/ CM
3 of 5 series · 12 of 33 positions shown, 14 images · IV contrast (omnipaque)
Comparison: None.

CLINICAL DATA: Right-sided swelling and pharyngeal pain.

EXAM:
CT NECK WITH CONTRAST
TECHNIQUE: Multidetector CT imaging of the neck was performed using the
standard protocol following the bolus administration of intravenous
contrast.
CONTRAST:  75mL OMNIPAQUE IOHEXOL 300 MG/ML  SOLN

[Series 6: sag neck · sagittal · 0.59mm/px · 5 of 84 slices shown, 6 images]
[im 28/84  bone]
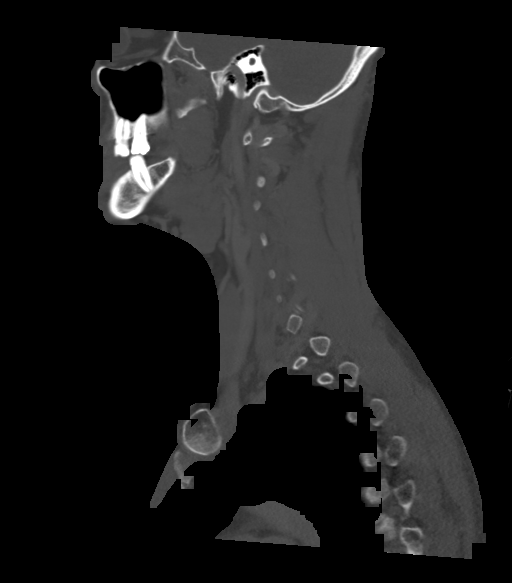
[im 35/84  bone]
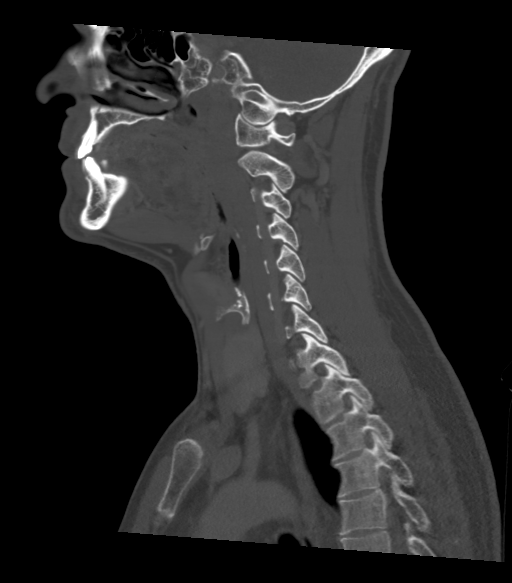
[im 42/84  soft-tissue]
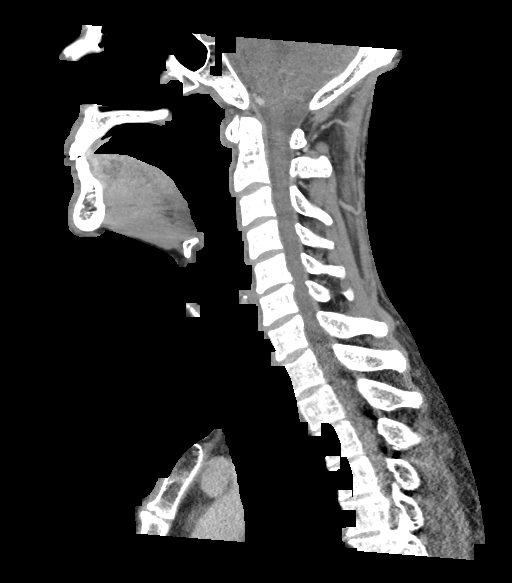
[im 42/84  bone]
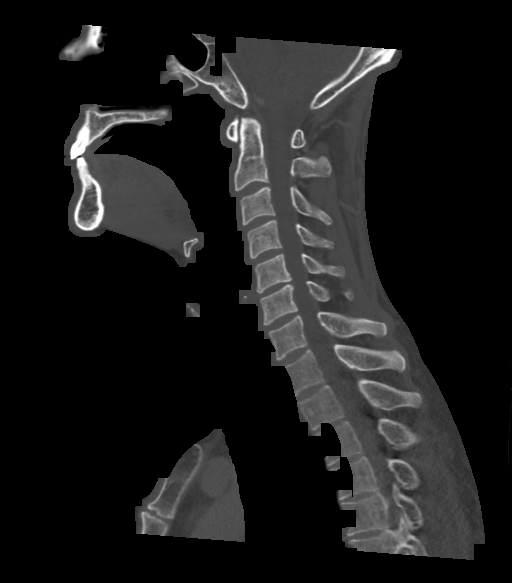
[im 49/84  bone]
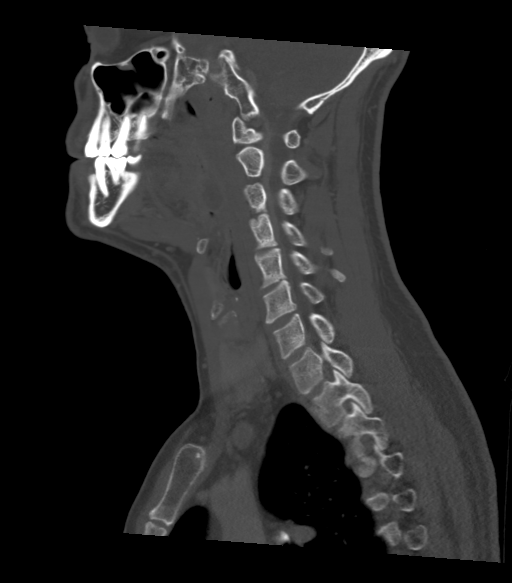
[im 56/84  bone]
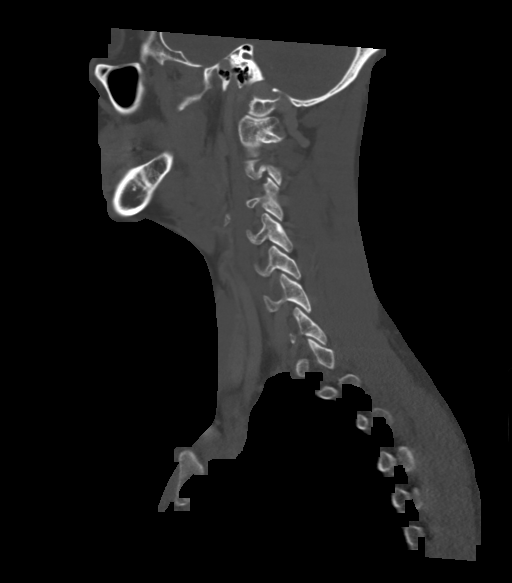

[Series 7: cor neck · coronal · 0.40mm/px · 3 of 133 slices shown]
[im 27/133  bone]
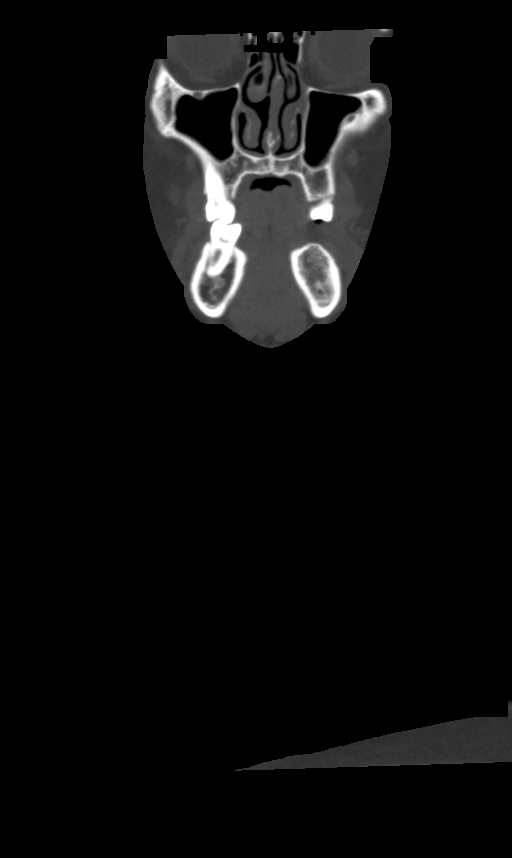
[im 53/133  bone]
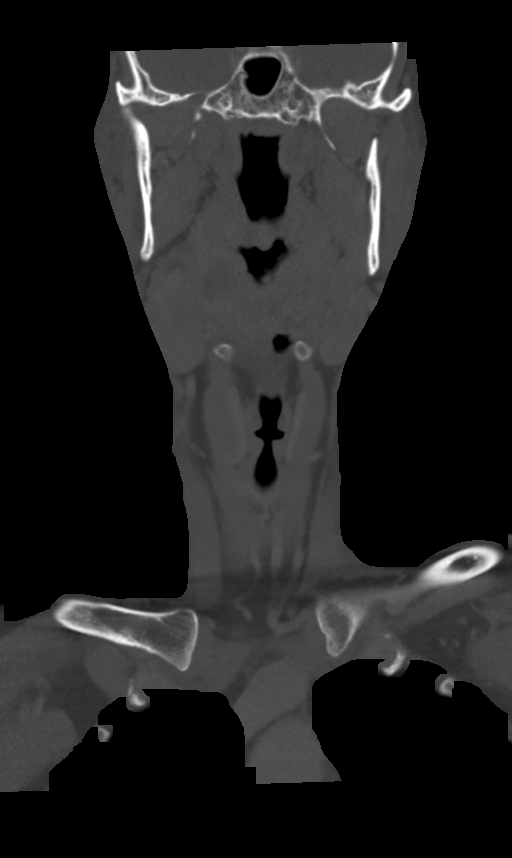
[im 80/133  bone]
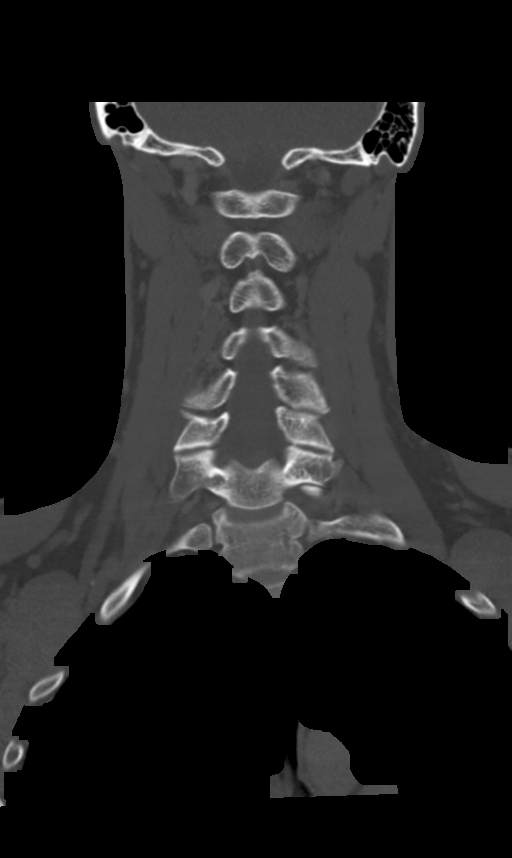

[Series 8: orthogonal ax · axial · 0.44mm/px · z∈[-346,-143]mm · 4 of 176 slices shown, 5 images]
[im 36/176  soft-tissue]
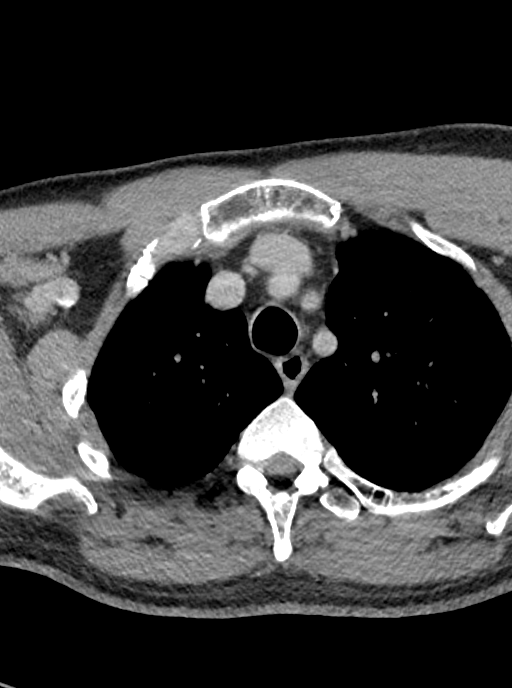
[im 36/176  bone]
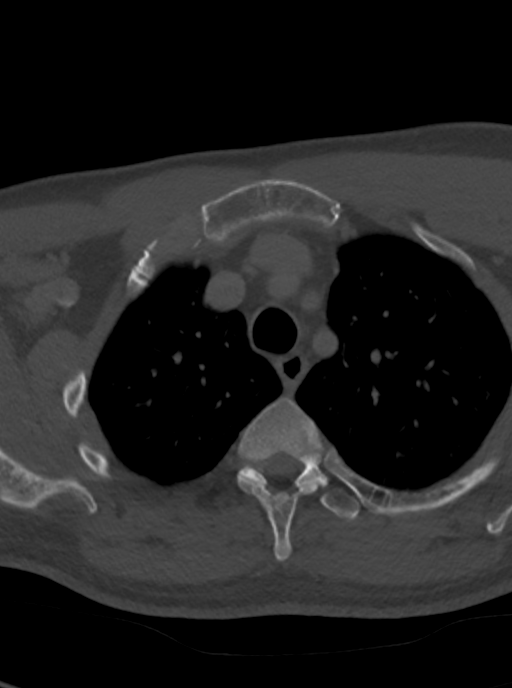
[im 71/176  bone]
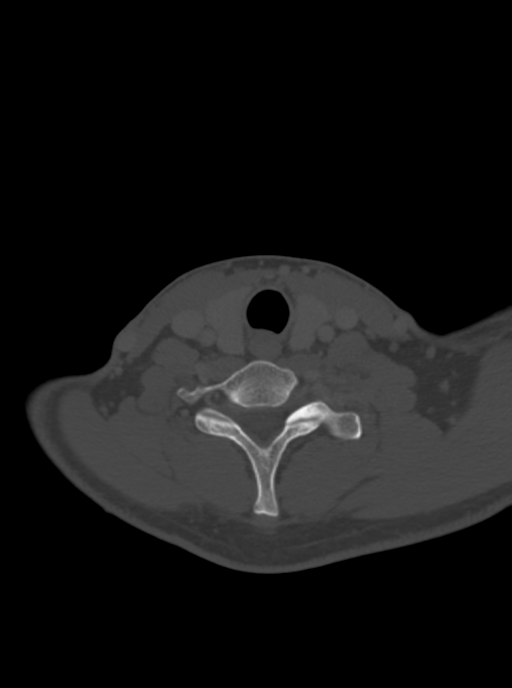
[im 106/176  bone]
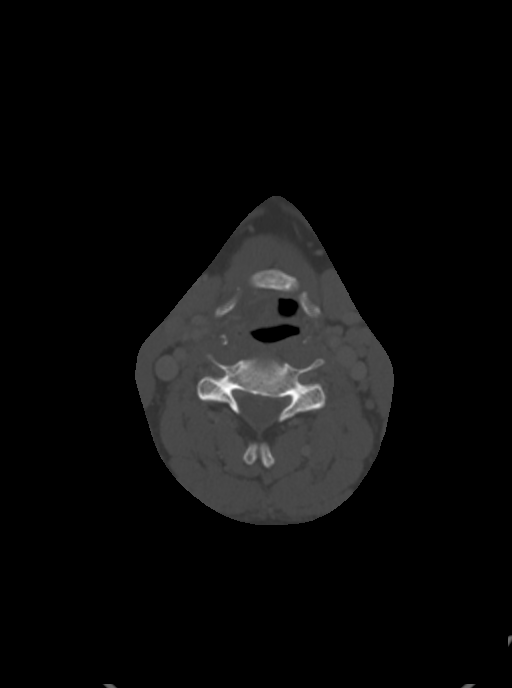
[im 141/176  bone]
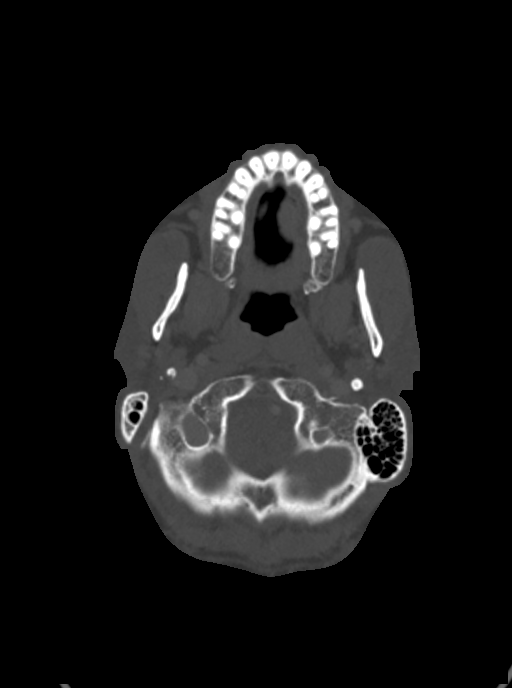

[12 of 33 positions shown; findings below may reference images not displayed]

FINDINGS: PHARYNX AND LARYNX:

--Nasopharynx: Fossae of Kopp are clear. Normal adenoid
tonsils for age.

--Oral cavity and oropharynx: Right palatine peritonsillar abscess
measures 2.3 x 1.1 cm.

--Hypopharynx: There is edema that extends into the right vallecula.

--Larynx: Normal epiglottis and pre-epiglottic space. Normal
aryepiglottic and vocal folds.

--Retropharyngeal space: No abscess, effusion or lymphadenopathy.

SALIVARY GLANDS:

--Parotid: No mass lesion or inflammation. No sialolithiasis or
ductal dilatation.

--Submandibular: Symmetric without inflammation. No sialolithiasis
or ductal dilatation.

--Sublingual: Normal. No ranula or other visible lesion of the base
of tongue and floor of mouth.

THYROID: Normal.

LYMPH NODES: Right level 2A nodes measure up to 12 mm. Left level 2A
nodes measure 11 mm.

VASCULAR: Major cervical vessels are patent.

LIMITED INTRACRANIAL: Normal.

VISUALIZED ORBITS: Normal.

MASTOIDS AND VISUALIZED PARANASAL SINUSES: No fluid levels or
advanced mucosal thickening. No mastoid effusion.

SKELETON: No bony spinal canal stenosis. No lytic or blastic
lesions.

UPPER CHEST: Clear.

OTHER: None.
IMPRESSION: 1. Right palatine peritonsillar abscess measuring 23 x 11 mm with
edema that extends to the right vallecula.
2. No retropharyngeal abscess or fluid collection.
3. Reactive bilateral level 2A cervical lymph nodes.

## 2023-02-06 ENCOUNTER — Emergency Department (HOSPITAL_COMMUNITY): Payer: 59

## 2023-02-06 ENCOUNTER — Encounter (HOSPITAL_COMMUNITY): Payer: Self-pay | Admitting: Emergency Medicine

## 2023-02-06 ENCOUNTER — Other Ambulatory Visit: Payer: Self-pay

## 2023-02-06 ENCOUNTER — Emergency Department (HOSPITAL_COMMUNITY)
Admission: EM | Admit: 2023-02-06 | Discharge: 2023-02-06 | Disposition: A | Discharge status: Home / Self Care | Payer: 59 | Attending: Emergency Medicine | Admitting: Emergency Medicine

## 2023-02-06 DIAGNOSIS — R079 Chest pain, unspecified: Secondary | ICD-10-CM

## 2023-02-06 LAB — BASIC METABOLIC PANEL
Anion gap: 10 (ref 5–15)
BUN: 18 mg/dL (ref 6–20)
CO2: 25 mmol/L (ref 22–32)
Calcium: 9.6 mg/dL (ref 8.9–10.3)
Chloride: 103 mmol/L (ref 98–111)
Creatinine, Ser: 0.8 mg/dL (ref 0.61–1.24)
GFR, Estimated: >60 mL/min (ref >60)
Glucose, Bld: 137 mg/dL — ABNORMAL HIGH (ref 70–99)
Potassium: 4.1 mmol/L (ref 3.5–5.1)
Sodium: 138 mmol/L (ref 135–145)

## 2023-02-06 LAB — CBC
HCT: 50.1 % (ref 39.0–52.0)
Hemoglobin: 17.5 g/dL — ABNORMAL HIGH (ref 13.0–17.0)
MCH: 31 pg (ref 26.0–34.0)
MCHC: 34.9 g/dL (ref 30.0–36.0)
MCV: 88.8 fL (ref 80.0–100.0)
Platelets: 329 10*3/uL (ref 150–400)
RBC: 5.64 MIL/uL (ref 4.22–5.81)
RDW: 12.7 % (ref 11.5–15.5)
WBC: 8.2 10*3/uL (ref 4.0–10.5)
nRBC: 0 % (ref 0.0–0.2)

## 2023-02-06 LAB — TROPONIN I (HIGH SENSITIVITY)
Troponin I (High Sensitivity): <2 ng/L (ref <18)
Troponin I (High Sensitivity): 3 ng/L (ref <18)

## 2023-02-06 NOTE — ED Provider Notes (Signed)
Patient care assumed at shift handoff from previous provider. See their note for full detail  In short, 47 year old male patient presented to the emergency department complaining of chest pain.  Pain began last night and last a few seconds while playing videogames.  Patient had another episode of pain this morning feeling slightly lightheaded.  He denies shortness of breath, pleuritic pain, back pain, nausea, vomiting, diaphoresis.  This is the first time he has experienced this. Physical Exam  BP (!) 135/95   Pulse 79   Temp 98.1 F (36.7 C)   Resp 12   Ht 5\' 11"  (1.803 m)   Wt 86 kg   SpO2 99%   BMI 26.44 kg/m   Physical Exam  Procedures  Procedures  ED Course / MDM    Medical Decision Making Amount and/or Complexity of Data Reviewed Labs: ordered. Radiology: ordered.   Pain care assumed pending delta troponin.  Plan for discharge home if troponins are flat.   Repeat troponin 3.  Very low clinical suspicion at this time of ACS.  Unclear cause of patient's chest pain but doubt any life-threatening etiologies such as ACS, PE, dissection.  No pneumonia on chest x-ray.  Plan to discharge home at this time with outpatient cardiology follow-up.  Return precautions provided.      Dorothyann Peng, PA-C 02/06/23 1736    Lorelle Gibbs, DO 02/06/23 2335

## 2023-02-06 NOTE — ED Triage Notes (Signed)
Patient states last night his teeth became sore, and started having chest pains at 11 p.m. while playing video games. Describes it as mid chest pressure for 3-4 seconds intermittently and then stopped completely. Patient denies n/v/d. Endorses SHOB with chest pain.

## 2023-02-06 NOTE — Discharge Instructions (Signed)
You were evaluated today for nonspecific chest pain.  I have placed a referral for cardiology to follow-up as an outpatient.  If you develop any new life-threatening symptoms such as worsening chest pain or shortness of breath, please return to the emergency department.

## 2023-02-06 NOTE — ED Provider Notes (Signed)
[facility] Provider Note   CSN: [id number] Arrival date & time: 02/06/23  1217     History  Chief Complaint  Patient presents with   Chest Pain    Christian Santiago is a 47 y.o. male.  Pain that started last night and lasted a few seconds while playing videogames.  Had another episode this morning, states he felt slightly lightheaded, denies shortness of breath, no pleuritic pain, no pain in his back, no nausea vomiting or diaphoresis.  He is never has the past, no history of high blood pressure high cholesterol, no family history of cardiac disease, he is not a smoker.   Chest Pain      Home Medications Prior to Admission medications   Medication Sig Start Date End Date Taking? Authorizing Provider  azithromycin (ZITHROMAX) 250 MG tablet Take 1-2 tablets by mouth as directed. Take 2 tablets on day 1, then 1 tablet daily on days 2-5 09/28/18   [provider]  fluticasone (FLONASE) 50 MCG/ACT nasal spray Place 2 sprays into the nose daily. Patient not taking: Reported on 09/30/2018 08/25/13   Jackolyn Confer, MD  ipratropium (ATROVENT) 0.06 % nasal spray Place 2 sprays into both nostrils 3 (three) times daily. 09/28/18   [provider]      Allergies    Patient has no known allergies.    Review of Systems   Review of Systems  Cardiovascular:  Positive for chest pain.    Physical Exam Updated Vital Signs BP 120/84   Pulse 87   Temp 98.1 F (36.7 C)   Resp 10   Ht 5\' 11"  (1.803 m)   Wt 86 kg   SpO2 98%   BMI 26.44 kg/m  Physical Exam Vitals and nursing note reviewed.  Constitutional:      General: He is not in acute distress.    Appearance: He is well-developed.  HENT:     Head: Normocephalic and atraumatic.  Eyes:     Conjunctiva/sclera: Conjunctivae normal.  Cardiovascular:     Rate and Rhythm: Normal rate and regular rhythm.     Heart sounds: No murmur heard. Pulmonary:     Effort: Pulmonary  effort is normal. No respiratory distress.     Breath sounds: Normal breath sounds.  Abdominal:     Palpations: Abdomen is soft.     Tenderness: There is no abdominal tenderness.  Musculoskeletal:        General: No swelling.     Cervical back: Neck supple.  Skin:    General: Skin is warm and dry.     Capillary Refill: Capillary refill takes less than 2 seconds.  Neurological:     Mental Status: He is alert.  Psychiatric:        Mood and Affect: Mood normal.     ED Results / Procedures / Treatments   Labs (all labs ordered are listed, but only abnormal results are displayed) Labs Reviewed  BASIC METABOLIC PANEL - Abnormal; Notable for the following components:      Result Value   Glucose, Bld 137 (*)    All other components within normal limits  CBC - Abnormal; Notable for the following components:   Hemoglobin 17.5 (*)    All other components within normal limits  TROPONIN I (HIGH SENSITIVITY)  TROPONIN I (HIGH SENSITIVITY)    EKG None  Radiology DG Chest 2 View  Result Date: 02/06/2023 CLINICAL DATA:  Intermittent chest pain since last evening  EXAM: CHEST - 2 VIEW COMPARISON:  None Available. FINDINGS: Normal heart size, mediastinal contours, and pulmonary vascularity. Lungs clear. No pulmonary infiltrate, pleural effusion, or pneumothorax. Osseous structures unremarkable. IMPRESSION: No acute abnormalities. Electronically Signed   By: Lavonia Dana M.D.   On: 02/06/2023 12:59    Procedures Procedures    Medications Ordered in ED Medications - No data to display  ED Course/ Medical Decision Making/ A&P                             Medical Decision Making The patient presented today for chest pain that had started last night. EKG showed normal sinus rhythm, Chest Xray was independently reviewed by me and shows no pulmonary edema or infiltrate. I agree with radiology interpretation.   Symptoms had resolved in the ED  I considered a broad differential including  but not limited to ACS, PE, Dissection, pneumothorax, costochondritis, pneumonia, GERD, pericarditis, and pericardial effusion.  PE is considered very low risk, patient was no history of DVT or PE, no pleuritic pain, no shortness of breath.  He is not tachycardic  I feel disssection is extremely unlikely  Symptoms and EKG are not consisted with pericardial effusion  Their heart score is low.  Second Lance Bosch is pending  Plan is for discharge pending negative second Lance Bosch, patient is asking for cardiology follow-up  Signed out to the next shift     Amount and/or Complexity of Data Reviewed Labs: ordered. Radiology: ordered.           Final Clinical Impression(s) / ED Diagnoses Final diagnoses:  Chest pain, unspecified type    Rx / DC Orders ED Discharge Orders     None         Darci Current 02/06/23 1534    Lacretia Leigh, MD 02/07/23 1400

## 2023-02-06 NOTE — ED Provider Triage Note (Signed)
Emergency Medicine Provider Triage Evaluation Note  Christian Santiago , a 47 y.o. male  was evaluated in triage.  Pt complains of intermittent generalized chest pains. Began last night while he was playing video games. Lasts a few seconds then resolves. States the pain takes his breath away but denies shortness of breath. Denies cardiac history, nausea, vomiting, diaphoresis, dizziness, light headedness. Pain is not exertional. Denies history of hypertension  Review of Systems  Positive: As above Negative: As above  Physical Exam  BP (!) 184/97 (BP Location: Right Arm)   Pulse (!) 112   Temp 98.1 F (36.7 C)   Resp 18   Ht 5\' 11"  (1.803 m)   Wt 86 kg   SpO2 98%   BMI 26.44 kg/m  Gen:   Awake, no distress   Resp:  Normal effort  MSK:   Moves extremities without difficulty  Other:    Medical Decision Making  Medically screening exam initiated at 12:37 PM.  Appropriate orders placed.  Christian Santiago was informed that the remainder of the evaluation will be completed by another provider, this initial triage assessment does not replace that evaluation, and the importance of remaining in the ED until their evaluation is complete.  ACS workup initiated   Nehemiah Massed 02/06/23 1239

## 2023-03-03 ENCOUNTER — Ambulatory Visit: Payer: 59 | Attending: Interventional Cardiology | Admitting: Cardiovascular Disease

## 2023-03-03 ENCOUNTER — Encounter: Payer: Self-pay | Admitting: Cardiovascular Disease

## 2023-03-03 VITALS — BP 142/82 | HR 97 | Ht 71.0 in | Wt 207.0 lb

## 2023-03-03 DIAGNOSIS — D751 Secondary polycythemia: Secondary | ICD-10-CM | POA: Diagnosis not present

## 2023-03-03 DIAGNOSIS — R079 Chest pain, unspecified: Secondary | ICD-10-CM

## 2023-03-03 DIAGNOSIS — R030 Elevated blood-pressure reading, without diagnosis of hypertension: Secondary | ICD-10-CM | POA: Diagnosis not present

## 2023-03-03 NOTE — Patient Instructions (Signed)
Medication Instructions:  No changes *If you need a refill on your cardiac medications before your next appointment, please call your pharmacy*   Lab Work: Fasting Lipid panel- Please return for Blood Work. No appointment needed, lab here at the office is open Monday-Friday from 8AM to 4PM.   If you have labs (blood work) drawn today and your tests are completely normal, you will receive your results only by: MyChart Message (if you have MyChart) OR A paper copy in the mail If you have any lab test that is abnormal or we need to change your treatment, we will call you to review the results.   Testing/Procedures:                       Patient Instructions for Treadmill/Exercise Stress Test  Medication instructions:   N/A  Do not eat, drink or use tobacco products four hours prior to the test.  Water is ok.  3.  Dress prepared to exercise in a comfortable, two piece clothing outfit and walking shoes.  4.  Bring any current prescription medications with you the day of the test.  5.  Notify the office 24 hours in advance if you cannot keep this appointment.  6.  If you have any questions, please call [phone].    Follow-Up: At [facility], you and your health needs are our priority.  As part of our continuing mission to provide you with exceptional heart care, we have created designated Provider Care Teams.  These Care Teams include your primary Cardiologist (physician) and Advanced Practice Providers (APPs -  Physician Assistants and Nurse Practitioners) who all work together to provide you with the care you need, when you need it.  We recommend signing up for the patient portal called "MyChart".  Sign up information is provided on this After Visit Summary.  MyChart is used to connect with patients for Virtual Visits (Telemedicine).  Patients are able to view lab/test results, encounter notes, upcoming appointments, etc.  Non-urgent messages can be sent to your provider as  well.   To learn more about what you can do with MyChart, go to [email or web address].    Your next appointment:    Follow up as needed  Provider:   Dr Royann Shivers

## 2023-03-03 NOTE — Progress Notes (Signed)
Cardiology Office Note:    Date:  03/05/2023   ID:  Christian Santiago, DOB 09/19/1976, MRN [id number]  PCP:  Christian Grana, FNP   [facility] Providers Cardiologist:  None     Referring MD: Christian Grinder, PA-C   Chief Complaint  Patient presents with   Chest Pain  Christian Santiago is a 47 y.o. male who is being seen today for the evaluation of chest pain at the request of Christian Santiago.   History of Present Illness:    Christian Santiago is a 47 y.o. male with a hx of generally excellent health with the exception of migraine headaches and previous history of asthma, who has recently experienced a few episodes of chest pain.  This did not occur during physical exertion, but rather while he was playing videogames.  The discomfort begins in the teeth of his lower jaw and then radiates to his chest with a sensation of tightness.  It is quite brief lasting for just several seconds.  He is very physically active, working as a Copywriter, advertising for [employer]  He does not have any chest discomfort or shortness of breath during intense physical activity.  He was seen for these complaints in the emergency room on 02/06/2023, after 2 episodes occurring within 24 hours of each other.  Workup included a low risk ECG and normal high-sensitivity troponin levels.  His chest x-ray was also normal.  Labs were significant for hemoglobin of 17.5.  Creatinine was normal at 0.8.  The patient specifically denies  dyspnea at rest or with exertion, orthopnea, paroxysmal nocturnal dyspnea, syncope, palpitations, focal neurological deficits, intermittent claudication, lower extremity edema, unexplained weight gain, cough, hemoptysis or wheezing.  Does not have daytime hypersomnolence.  He does snore.  No witnessed apnea.  He quit smoking about 2 years ago but smoked 1-2 packs/day for about 30 years.  His blood pressure is slightly elevated today and has been in his borderline range in a couple  of recent assessments.  He does not know if he had his cholesterol checked anytime in the recent past.  He does not have diabetes mellitus.  He does not have a family history of early onset CAD, but his brother is currently having some heart rhythm issues.  Christian Santiago's family is great, from an area [place] of [place].  Past Medical History:  Diagnosis Date   Asthma    Migraines    Sinus disease    deviated septum    Past Surgical History:  Procedure Laterality Date   KNEE ARTHROSCOPY     bilateral, torn meniscus and torn quad   WRIST GANGLION EXCISION      Current Medications: Current Meds  Medication Sig   fluticasone (FLONASE) 50 MCG/ACT nasal spray Place 2 sprays into the nose daily.     Allergies:   Patient has no known allergies.   Social History   Socioeconomic History   Marital status: Married    Spouse name: Not on file   Number of children: Not on file   Years of education: Not on file   Highest education level: Not on file  Occupational History   Not on file  Tobacco Use   Smoking status: Former    Types: Cigarettes    Quit date: 01/2021    Years since quitting: 2.1   Smokeless tobacco: Never  Vaping Use   Vaping Use: Every day  Substance and Sexual Activity   Alcohol use: Yes  Comment: wine on weekends   Drug use: No   Sexual activity: Yes  Other Topics Concern   Not on file  Social History Narrative   Lives in [place] alone. No pets      Work - Immunologist work      Diet - regular      Exercise - limited, very active at work   Social Determinants of Corporate investment banker Strain: Not on [employer] Insecurity: Not on file  Transportation Needs: Not on file  Physical Activity: Not on file  Stress: Not on file  Social Connections: Not on file     Family History: The patient's family history includes Hypertension in his father; Thyroid disease in his mother. There is no history of Prostate cancer.  ROS:   Please see the  history of present illness.     All other systems reviewed and are negative.  EKGs/Labs/Other Studies Reviewed:    The following studies were reviewed today: X-ray, labs, ECG, notes from ER visit on 02/06/2023  EKG:  EKG is not ordered today.  The ekg ordered on 02/06/2023 was personally reviewed and demonstrates sinus tachycardia 110 bpm but otherwise normal tracing.  QTc 433 ms.  Recent Labs: 02/06/2023: BUN 18; Creatinine, Ser 0.80; Hemoglobin 17.5; Platelets 329; Potassium 4.1; Sodium 138  Recent Lipid Panel No results found for: "CHOL", "TRIG", "HDL", "CHOLHDL", "VLDL", "LDLCALC", "LDLDIRECT"   Risk Assessment/Calculations:       Recheck BP 140/89 mmHg. monitor for now.  Reevaluate at time of stress test.       Physical Exam:    VS:  BP (!) 142/82   Pulse 97   Ht 5\' 11"  (1.803 m)   Wt 207 lb (93.9 kg)   SpO2 99%   BMI 28.87 kg/m     Wt Readings from Last 3 Encounters:  03/03/23 207 lb (93.9 kg)  02/06/23 189 lb 9.5 oz (86 kg)  09/30/18 191 lb (86.6 kg)     GEN: Mildly overweight, but also appears very fit, well nourished, well developed in no acute distress HEENT: Normal NECK: No JVD; No carotid bruits LYMPHATICS: No lymphadenopathy CARDIAC: RRR, no murmurs, rubs, gallops RESPIRATORY:  Clear to auscultation without rales, wheezing or rhonchi  ABDOMEN: Soft, non-tender, non-distended MUSCULOSKELETAL:  No edema; No deformity  SKIN: Warm and dry NEUROLOGIC:  Alert and oriented x 3 PSYCHIATRIC:  Normal affect   ASSESSMENT:    1. Chest pain of uncertain etiology   2. Elevated blood pressure reading   3. Polycythemia    PLAN:    In order of problems listed above:  Precordial pain: Atypical due to his very short duration and onset at rest, not replicated doing intense physical activity.  Suspect noncardiac etiology, possibly gastroesophageal.  Will schedule for treadmill stress test.  Limited coronary risk factors.  Need to get his cholesterol  checked. Elevated blood pressure: Marginal elevation.  Reevaluate at the time of the stress test.  No medications prescribed at this time. Polycythemia: Remarkably high hemoglobin.  Despite his longstanding history of smoking has not smoked in 2 years and does not have any clinical findings to suggest COPD.  99% oxygen saturation on room air.  Does not have symptoms of obstructive sleep apnea.  Would recheck.  5 years ago his hemoglobin was normal at 15.  Could just be [place] syndrome      Shared Decision Making/Informed Consent The risks [chest pain, shortness of breath, cardiac arrhythmias, dizziness, blood  pressure fluctuations, myocardial infarction, stroke/transient ischemic attack, and life-threatening complications (estimated to be 1 in 10,000)], benefits (risk stratification, diagnosing coronary artery disease, treatment guidance) and alternatives of an exercise tolerance test were discussed in detail with Christian Santiago and he agrees to proceed.    Medication Adjustments/Labs and Tests Ordered: Current medicines are reviewed at length with the patient today.  Concerns regarding medicines are outlined above.  Orders Placed This Encounter  Procedures   Lipid panel   Cardiac Stress Test: Informed Consent Details: Physician/Practitioner Attestation; Transcribe to consent form and obtain patient signature   EXERCISE TOLERANCE TEST (ETT)   No orders of the defined types were placed in this encounter.   Patient Instructions  Medication Instructions:  No changes *If you need a refill on your cardiac medications before your next appointment, please call your pharmacy*   Lab Work: Fasting Lipid panel- Please return for Blood Work. No appointment needed, lab here at the office is open Monday-Friday from 8AM to 4PM.   If you have labs (blood work) drawn today and your tests are completely normal, you will receive your results only by: MyChart Message (if you have MyChart) OR A paper copy  in the mail If you have any lab test that is abnormal or we need to change your treatment, we will call you to review the results.   Testing/Procedures:                       Patient Instructions for Treadmill/Exercise Stress Test  Medication instructions:   N/A  Do not eat, drink or use tobacco products four hours prior to the test.  Water is ok.  3.  Dress prepared to exercise in a comfortable, two piece clothing outfit and walking shoes.  4.  Bring any current prescription medications with you the day of the test.  5.  Notify the office 24 hours in advance if you cannot keep this appointment.  6.  If you have any questions, please call [phone].    Follow-Up: At [facility], you and your health needs are our priority.  As part of our continuing mission to provide you with exceptional heart care, we have created designated Provider Care Teams.  These Care Teams include your primary Cardiologist (physician) and Advanced Practice Providers (APPs -  Physician Assistants and Nurse Practitioners) who all work together to provide you with the care you need, when you need it.  We recommend signing up for the patient portal called "MyChart".  Sign up information is provided on this After Visit Summary.  MyChart is used to connect with patients for Virtual Visits (Telemedicine).  Patients are able to view lab/test results, encounter notes, upcoming appointments, etc.  Non-urgent messages can be sent to your provider as well.   To learn more about what you can do with MyChart, go to [email or web address].    Your next appointment:    Follow up as needed  Provider:   Dr Royann Shivers    Signed, Thurmon Fair, MD  03/05/2023 4:50 PM    [facility]

## 2023-03-05 ENCOUNTER — Encounter: Payer: Self-pay | Admitting: Cardiovascular Disease

## 2023-03-07 LAB — LIPID PANEL
Chol/HDL Ratio: 5.2 ratio — ABNORMAL HIGH (ref 0.0–5.0)
Cholesterol, Total: 196 mg/dL (ref 100–199)
HDL: 38 mg/dL — ABNORMAL LOW (ref >39)
LDL Chol Calc (NIH): 129 mg/dL — ABNORMAL HIGH (ref 0–99)
Triglycerides: 164 mg/dL — ABNORMAL HIGH (ref 0–149)
VLDL Cholesterol Cal: 29 mg/dL (ref 5–40)

## 2023-03-10 ENCOUNTER — Encounter (HOSPITAL_COMMUNITY): Payer: Self-pay | Admitting: *Deleted

## 2023-03-10 ENCOUNTER — Encounter: Payer: Self-pay | Admitting: *Deleted

## 2023-03-10 ENCOUNTER — Telehealth: Payer: Self-pay | Admitting: *Deleted

## 2023-03-10 NOTE — Telephone Encounter (Signed)
Letter sent to my chart with instructions for upcoming GXT.  Ricky Ala, RN

## 2023-03-17 ENCOUNTER — Ambulatory Visit: Payer: 59 | Admitting: Interventional Cardiology

## 2023-03-18 ENCOUNTER — Ambulatory Visit (HOSPITAL_COMMUNITY): Payer: 59 | Attending: Cardiovascular Disease

## 2023-03-18 DIAGNOSIS — R079 Chest pain, unspecified: Secondary | ICD-10-CM

## 2023-03-18 LAB — EXERCISE TOLERANCE TEST
Angina Index: 0
Base ST Depression (mm): 0 mm
Duke Treadmill Score: 10
Estimated workload: 11.7
Exercise duration (min): 10 min
Exercise duration (sec): 1 s
MPHR: 172 {beats}/min
Peak HR: 155 {beats}/min
Percent HR: 90 %
Rest HR: 94 {beats}/min
ST Depression (mm): 0 mm

## 2024-02-03 ENCOUNTER — Ambulatory Visit: Payer: 59 | Admitting: Family Medicine

## 2024-03-01 NOTE — Progress Notes (Unsigned)
   New Patient Office Visit  Subjective    Patient ID: Christian Santiago, male    DOB: June 10, 1976  Age: 48 y.o. MRN: [id number]  CC: No chief complaint on file.   HPI Christian Santiago presents to establish care. Patient has seen Dr. Birdie Sons in the past, last visit with him was in 12/28/2017 for chronic diarrhea.   1)   Patient is listed to have h/o asthma, migraines, genital warts (08/25/13 visit with Dr. Ronna Polio), allergic rhinitis. Was seen in ED on 02/06/23 for chest pain.     Outpatient Encounter Medications as of 03/03/2024  Medication Sig   fluticasone (FLONASE) 50 MCG/ACT nasal spray Place 2 sprays into the nose daily.   No facility-administered encounter medications on file as of 03/03/2024.    Past Medical History:  Diagnosis Date   Asthma    Migraines    Sinus disease    deviated septum    Past Surgical History:  Procedure Laterality Date   KNEE ARTHROSCOPY     bilateral, torn meniscus and torn quad   WRIST GANGLION EXCISION      Family History  Problem Relation Age of Onset   Thyroid disease Mother    Hypertension Father    Prostate cancer Neg Hx     Social History   Socioeconomic History   Marital status: Married    Spouse name: Not on file   Number of children: Not on file   Years of education: Not on file   Highest education level: Not on file  Occupational History   Not on file  Tobacco Use   Smoking status: Former    Current packs/day: 0.00    Types: Cigarettes    Quit date: 01/2021    Years since quitting: 3.1   Smokeless tobacco: Never  Vaping Use   Vaping status: Every Day  Substance and Sexual Activity   Alcohol use: Yes    Comment: wine on weekends   Drug use: No   Sexual activity: Yes  Other Topics Concern   Not on file  Social History Narrative   Lives in [place] alone. No pets      Work - Immunologist work      Diet - regular      Exercise - limited, very active at work   Social Drivers of Manufacturing engineer Strain: Not on [employer] Insecurity: Not on file  Transportation Needs: Not on file  Physical Activity: Not on file  Stress: Not on file (10/03/2023)  Social Connections: Not on file  Intimate Partner Violence: Not on file    ROS      Objective    There were no vitals taken for this visit.  Physical Exam  {Labs (Optional):23779}    Assessment & Plan:   Problem List Items Addressed This Visit   None   No follow-ups on file.   Skip Estimable, MD

## 2024-03-03 ENCOUNTER — Ambulatory Visit (INDEPENDENT_AMBULATORY_CARE_PROVIDER_SITE_OTHER)

## 2024-03-03 VITALS — BP 140/90 | HR 77 | Temp 97.7°F | Ht 71.0 in | Wt 201.0 lb

## 2024-03-03 DIAGNOSIS — Z1211 Encounter for screening for malignant neoplasm of colon: Secondary | ICD-10-CM

## 2024-03-03 DIAGNOSIS — Z131 Encounter for screening for diabetes mellitus: Secondary | ICD-10-CM

## 2024-03-03 DIAGNOSIS — E782 Mixed hyperlipidemia: Secondary | ICD-10-CM

## 2024-03-03 DIAGNOSIS — G43109 Migraine with aura, not intractable, without status migrainosus: Secondary | ICD-10-CM | POA: Insufficient documentation

## 2024-03-03 DIAGNOSIS — Z0000 Encounter for general adult medical examination without abnormal findings: Secondary | ICD-10-CM | POA: Diagnosis not present

## 2024-03-03 DIAGNOSIS — Z1159 Encounter for screening for other viral diseases: Secondary | ICD-10-CM

## 2024-03-03 DIAGNOSIS — R030 Elevated blood-pressure reading, without diagnosis of hypertension: Secondary | ICD-10-CM | POA: Insufficient documentation

## 2024-03-03 DIAGNOSIS — L2489 Irritant contact dermatitis due to other agents: Secondary | ICD-10-CM

## 2024-03-03 DIAGNOSIS — R7309 Other abnormal glucose: Secondary | ICD-10-CM

## 2024-03-03 DIAGNOSIS — N4889 Other specified disorders of penis: Secondary | ICD-10-CM

## 2024-03-03 DIAGNOSIS — D751 Secondary polycythemia: Secondary | ICD-10-CM | POA: Insufficient documentation

## 2024-03-03 DIAGNOSIS — Z23 Encounter for immunization: Secondary | ICD-10-CM | POA: Diagnosis not present

## 2024-03-03 DIAGNOSIS — J301 Allergic rhinitis due to pollen: Secondary | ICD-10-CM

## 2024-03-03 NOTE — Patient Instructions (Addendum)
-   I have ordered ultrasound and urology referral for the cyst like lesion evaluation. You will get a phone call to schedule an appointment.   - I recommend you check BP three times a week for the next 2-3 weeks and send [place] a blood pressure reading for review. If home BP more than 140/80 mmHg I recommend office visit to discuss blood pressure management. I have attached diet, exercise to help reduce blood pressure as well.   - Please schedule a follow up appointment for lab only visit. If abnormal labs I will recommend office visit otherwise follow up in 6 months.

## 2024-03-03 NOTE — Assessment & Plan Note (Signed)
 Had elevated Hb in 02/06/23. Recommend repeating CBC. Lab ordered.

## 2024-03-03 NOTE — Assessment & Plan Note (Signed)
 Due for colorectal cancer screening. Discussed colorectal cancer screening including colonoscopy, Cologuard.  Patient is agreeable on Cologuard.  I briefly counseled him if Cologuard positive he will need diagnostic colonoscopy.  Patient is agreeable.  Cologuard ordered today.  Patient is due for hepatitis C screening, screening lab.  Ordered.  Patient is due for Tdap booster.  Updating immunization, most common side effect related to immunization.  Patient agreeable on updating Tdap booster today.  He will qualify for low-dose CT lung cancer screening starting at age of 47.  Will recommend this during his next annual physical.  Counseled on USPSTF recommendation on alcohol intake for men.  Recommend reducing alcohol intake to no more than 2 drinks per sitting or 14 drinks in 1 week.    Has had elevated blood glucose in the past.  Recommended screening for diabetes with hemoglobin A1c.  Lab ordered.

## 2024-03-03 NOTE — Addendum Note (Signed)
 Addended by: Skip Estimable on: 03/03/2024 01:50 PM   Modules accepted: Level of Service

## 2024-03-03 NOTE — Assessment & Plan Note (Signed)
 Medical records review shows elevated BP readings in the past. BP on arrival was 160/88 and repeat BP towards the end of patient's visit was 140/90 mmHg. Goal BP is <130/80 mmHg. Given patient's h/o anxiety related to his appointment, intake of energy drinks before his appointment I would like him to check home BP 3 times a week for the next 2-3 weeks. He can send [place] his home BP reading, if home BP >130/80 mmHg recommend OV to start pharmacological intervention, ideally Lisinopril-hydrochlorothiazide. At the meantime I recommend starting DASH diet, lifestyle modification including reducing alcohol intake, moderate intensity exercise.

## 2024-03-03 NOTE — Assessment & Plan Note (Signed)
 Last episode about 7 months ago. Reports OTC Ibuprofen 400 mg, one time on onset of headache helps resolves symptoms completely. Currently stable.

## 2024-03-03 NOTE — Assessment & Plan Note (Signed)
 No skin lesions during today's visit. I recommend scheduling acute visit when he has these lesions or take a picture when he gets these lesions and share them with [place] during his next appointment.

## 2024-03-03 NOTE — Assessment & Plan Note (Signed)
 D/D includes epidermoid, sebaceous cyst, lymphocele, penile cancer. Recommend getting ultrasound and referral to urology for evaluation and potential excision of the cyst since this is causing stress to patient. Will check PSA level as well.

## 2024-03-03 NOTE — Assessment & Plan Note (Signed)
 Stable on nasal Flonase. Continue daily nasal Flonase, one spray to each nostril.

## 2024-03-16 ENCOUNTER — Other Ambulatory Visit (INDEPENDENT_AMBULATORY_CARE_PROVIDER_SITE_OTHER)

## 2024-03-16 ENCOUNTER — Ambulatory Visit
Admission: RE | Admit: 2024-03-16 | Discharge: 2024-03-16 | Disposition: A | Discharge status: Home / Self Care | Source: Ambulatory Visit

## 2024-03-16 DIAGNOSIS — Z131 Encounter for screening for diabetes mellitus: Secondary | ICD-10-CM | POA: Diagnosis not present

## 2024-03-16 DIAGNOSIS — N4889 Other specified disorders of penis: Secondary | ICD-10-CM | POA: Insufficient documentation

## 2024-03-16 DIAGNOSIS — E782 Mixed hyperlipidemia: Secondary | ICD-10-CM

## 2024-03-16 DIAGNOSIS — D751 Secondary polycythemia: Secondary | ICD-10-CM | POA: Diagnosis not present

## 2024-03-16 DIAGNOSIS — R7309 Other abnormal glucose: Secondary | ICD-10-CM

## 2024-03-16 DIAGNOSIS — Z1159 Encounter for screening for other viral diseases: Secondary | ICD-10-CM

## 2024-03-16 LAB — CBC WITH DIFFERENTIAL/PLATELET
Basophils Absolute: 0.1 10*3/uL (ref 0.0–0.1)
Basophils Relative: 2.2 % (ref 0.0–3.0)
Eosinophils Absolute: 0.2 10*3/uL (ref 0.0–0.7)
Eosinophils Relative: 3 % (ref 0.0–5.0)
HCT: 47.2 % (ref 39.0–52.0)
Hemoglobin: 15.9 g/dL (ref 13.0–17.0)
Lymphocytes Relative: 31.5 % (ref 12.0–46.0)
Lymphs Abs: 2.1 10*3/uL (ref 0.7–4.0)
MCHC: 33.6 g/dL (ref 30.0–36.0)
MCV: 89 fl (ref 78.0–100.0)
Monocytes Absolute: 0.6 10*3/uL (ref 0.1–1.0)
Monocytes Relative: 9.8 % (ref 3.0–12.0)
Neutro Abs: 3.5 10*3/uL (ref 1.4–7.7)
Neutrophils Relative %: 53.5 % (ref 43.0–77.0)
Platelets: 318 10*3/uL (ref 150.0–400.0)
RBC: 5.31 Mil/uL (ref 4.22–5.81)
RDW: 12.9 % (ref 11.5–15.5)
WBC: 6.6 10*3/uL (ref 4.0–10.5)

## 2024-03-16 LAB — LIPID PANEL
Cholesterol: 218 mg/dL — ABNORMAL HIGH (ref 0–200)
HDL: 42.3 mg/dL (ref >39.00)
LDL Cholesterol: 150 mg/dL — ABNORMAL HIGH (ref 0–99)
NonHDL: 175.65
Total CHOL/HDL Ratio: 5
Triglycerides: 129 mg/dL (ref 0.0–149.0)
VLDL: 25.8 mg/dL (ref 0.0–40.0)

## 2024-03-16 LAB — COMPREHENSIVE METABOLIC PANEL WITH GFR
ALT: 39 U/L (ref 0–53)
AST: 26 U/L (ref 0–37)
Albumin: 4.6 g/dL (ref 3.5–5.2)
Alkaline Phosphatase: 71 U/L (ref 39–117)
BUN: 23 mg/dL (ref 6–23)
CO2: 30 meq/L (ref 19–32)
Calcium: 9.7 mg/dL (ref 8.4–10.5)
Chloride: 99 meq/L (ref 96–112)
Creatinine, Ser: 0.81 mg/dL (ref 0.40–1.50)
GFR: 103.55 mL/min (ref >60.00)
Glucose, Bld: 95 mg/dL (ref 70–99)
Potassium: 4.3 meq/L (ref 3.5–5.1)
Sodium: 136 meq/L (ref 135–145)
Total Bilirubin: 0.6 mg/dL (ref 0.2–1.2)
Total Protein: 7.5 g/dL (ref 6.0–8.3)

## 2024-03-16 LAB — HEMOGLOBIN A1C: Hgb A1c MFr Bld: 5.7 % (ref 4.6–6.5)

## 2024-03-16 LAB — TSH: TSH: 0.95 u[IU]/mL (ref 0.35–5.50)

## 2024-03-16 LAB — PSA: PSA: 0.41 ng/mL (ref 0.10–4.00)

## 2024-03-17 LAB — HEPATITIS C ANTIBODY: Hepatitis C Ab: NONREACTIVE

## 2024-03-21 ENCOUNTER — Telehealth: Payer: Self-pay

## 2024-03-21 ENCOUNTER — Ambulatory Visit (INDEPENDENT_AMBULATORY_CARE_PROVIDER_SITE_OTHER): Admitting: Family Medicine

## 2024-03-21 ENCOUNTER — Encounter: Payer: Self-pay | Admitting: Family Medicine

## 2024-03-21 VITALS — BP 128/78 | HR 92 | Temp 98.6°F | Resp 20 | Ht 71.0 in | Wt 203.4 lb

## 2024-03-21 DIAGNOSIS — S00462A Insect bite (nonvenomous) of left ear, initial encounter: Secondary | ICD-10-CM

## 2024-03-21 DIAGNOSIS — W57XXXA Bitten or stung by nonvenomous insect and other nonvenomous arthropods, initial encounter: Secondary | ICD-10-CM | POA: Diagnosis not present

## 2024-03-21 NOTE — Telephone Encounter (Signed)
 Please call radiology department to see if we can get result on the scrotum ultrasound that was done on 03/16/24. He has upcoming urology appointment on 03/23/24, it would be helpful to have the result so that urology can review that during his visit.   Thank you,  Jacklin Mascot, MD

## 2024-03-21 NOTE — Telephone Encounter (Signed)
 Spoke with Radiology Department and representative says it has been added to be read soon.

## 2024-03-21 NOTE — Progress Notes (Signed)
 SUBJECTIVE:   Chief Complaint  Patient presents with   Insect Bite    One on left ear and other on right leg. Pulled off 04/16 or 4/17   HPI Presents for acute visit  Discussed the use of AI scribe software for clinical note transcription with the patient, who gave verbal consent to proceed.  History of Present Illness Christian Santiago is a 48 year old male who presents with numbness and tingling in the lips following recent tick bites.  He experienced two tick bites around April 15th or 16th, one on his ear and another on his leg. The tick on his ear was attached longer than the one on his leg, which was likely attached for less than 24 hours. Approximately ten days after the tick bites, he began experiencing numbness and tingling in his lips. The sensation started on a Friday, with his lips feeling numb and tingling intermittently. The numbness has since resolved, but occasionally he still feels a slight tingle that goes away on its own. History of migraine headache.  No other neurological symptoms.  He had blood work done on April 23rd for unrelated reasons, including a full panel for cholesterol. He has not experienced any droopiness or other symptoms typically associated with facial nerve issues. The area on his ear where the tick was attached had swollen significantly and was oozing, but it has since improved. The leg bite did not exhibit any significant swelling or redness.  He does not have any known allergies and is not currently on any medications. He has a history of high cholesterol. 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 This is the first tick he has pulled off himself in a year and a half. No droopiness, no significant swelling or redness at the tick bite sites currently, occasional tingling in lips, no fever, no big round spot at the tick bite sites.    PERTINENT PMH / PSH: As above  OBJECTIVE:  BP 128/78   Pulse 92    Temp 98.6 F (37 C)   Resp 20   Ht 5\' 11"  (1.803 m)   Wt 203 lb 6 oz (92.3 kg)   SpO2 98%   BMI 28.37 kg/m    Physical Exam Vitals reviewed.  Constitutional:      General: He is not in acute distress.    Appearance: Normal appearance. He is normal weight. He is not ill-appearing, toxic-appearing or diaphoretic.  Eyes:     General:        Right eye: No discharge.        Left eye: No discharge.  Cardiovascular:     Rate and Rhythm: Normal rate and regular rhythm.     Heart sounds: Normal heart sounds.  Pulmonary:     Effort: Pulmonary effort is normal.     Breath sounds: Normal breath sounds.  Musculoskeletal:        General: Normal range of motion.     Cervical back: Normal range of motion.  Skin:    General: Skin is warm and dry.     Findings: Lesion present.     Comments: Left outer helix noted to have scab  Neurological:     Mental Status: He is alert and oriented to person, place, and time. Mental status is at baseline.     Cranial Nerves: Cranial nerves 2-12 are intact.     Sensory: Sensation is intact.  Motor: Motor function is intact.     Coordination: Coordination is intact.     Gait: Gait is intact.  Psychiatric:        Mood and Affect: Mood normal.        Behavior: Behavior normal.        Thought Content: Thought content normal.        Judgment: Judgment normal.           03/21/2024    4:21 PM 03/03/2024    9:15 AM 12/28/2017    9:19 AM  Depression screen PHQ 2/9  Decreased Interest 0 0 0  Down, Depressed, Hopeless 0 0 0  PHQ - 2 Score 0 0 0  Altered sleeping 0 0   Tired, decreased energy 0 0   Change in appetite 0 0   Feeling bad or failure about yourself  0 0   Trouble concentrating 0 0   Moving slowly or fidgety/restless 0 0   Suicidal thoughts 0 0   PHQ-9 Score 0 0   Difficult doing work/chores Not difficult at all        03/21/2024    4:22 PM 03/03/2024    9:15 AM  GAD 7 : Generalized Anxiety Score  Nervous, Anxious, on Edge 0 0   Control/stop worrying 0 0  Worry too much - different things 0 0  Trouble relaxing 0 0  Restless 0 0  Easily annoyed or irritable 0 0  Afraid - awful might happen 0 0  Total GAD 7 Score 0 0  Anxiety Difficulty Not difficult at all     ASSESSMENT/PLAN:  Tick bite, unspecified site, initial encounter Assessment & Plan: Recent tick bites on ear and leg. No infection signs. Low risk Lyme disease, but prophylaxis considered due to exposure history. - Order Lyme disease antibody test. - Consider prophylactic doxycycline if Lyme disease test is positive. - Advised to monitor for infection signs or symptom worsening.  Orders: -     B. burgdorfi antibodies; Future   PDMP reviewed  Return if symptoms worsen or fail to improve, for PCP.  Valli Gaw, MD

## 2024-03-21 NOTE — Patient Instructions (Signed)
 It was a pleasure meeting you today. Thank you for allowing me to take part in your health care.  Our goals for today as we discussed include:  Lab appointment tomorrow.  If positive will send in for prescription for Doxycycline.     This is a list of the screening recommended for you and due dates:  Health Maintenance  Topic Date Due   Cologuard (Stool DNA test)  Never done   COVID-19 Vaccine (3 - 2024-25 season) 07/26/2023   Flu Shot  06/24/2024   DTaP/Tdap/Td vaccine (2 - Td or Tdap) 03/03/2034   Hepatitis C Screening  Completed   HIV Screening  Completed   HPV Vaccine  Aged Out   Meningitis B Vaccine  Aged Out      If you have any questions or concerns, please do not hesitate to call the office at [phone].  I look forward to our next visit and until then take care and stay safe.  Regards,   Valli Gaw, MD   [facility]

## 2024-03-21 NOTE — Telephone Encounter (Signed)
 I recommend acute visit to discuss tic bite within this week.   Thank you,  Jacklin Mascot, MD

## 2024-03-22 ENCOUNTER — Other Ambulatory Visit (INDEPENDENT_AMBULATORY_CARE_PROVIDER_SITE_OTHER)

## 2024-03-22 DIAGNOSIS — W57XXXA Bitten or stung by nonvenomous insect and other nonvenomous arthropods, initial encounter: Secondary | ICD-10-CM

## 2024-03-22 DIAGNOSIS — S00462A Insect bite (nonvenomous) of left ear, initial encounter: Secondary | ICD-10-CM | POA: Diagnosis not present

## 2024-03-23 ENCOUNTER — Ambulatory Visit (INDEPENDENT_AMBULATORY_CARE_PROVIDER_SITE_OTHER): Admitting: Urology

## 2024-03-23 VITALS — BP 126/85 | HR 83 | Ht 71.0 in | Wt 204.0 lb

## 2024-03-23 DIAGNOSIS — N489 Disorder of penis, unspecified: Secondary | ICD-10-CM | POA: Diagnosis not present

## 2024-03-23 LAB — B. BURGDORFI ANTIBODIES: B burgdorferi Ab IgG+IgM: <0.9 {index}

## 2024-03-23 MED ORDER — IMIQUIMOD 5 % EX CREA: TOPICAL_CREAM | CUTANEOUS | 0 refills | Status: DC

## 2024-03-23 NOTE — Patient Instructions (Signed)
 Removing a Pocket of Fluid in the Skin (Epidermoid/sebaceous Cyst Removal): What to Expect Epidermoid cyst removal is a procedure to remove a pocket of fluid that forms under the skin. This pocket of fluid is called an epidermoid cyst. It's filled with thick, oily substance that your skin glands make. They're usually harmless. If the cyst gets big, painful, or inflamed, your health care provider may suggest removing it. Tell a health care provider about: Any allergies you have. All medicines you take. These include vitamins, herbs, eye drops, and creams. Any problems you or family members have had with anesthesia. Any bleeding problems you have. Any surgeries you've had. Any medical conditions you have. Whether you're pregnant or may be pregnant. What are the risks? Your provider will talk with you about risks. These may include: The cyst coming back. Bleeding. Infection. Scarring. What happens before the procedure? Ask about changing or stopping: Any medicines you take. Any vitamins, herbs, or supplements you take. Do not take aspirin or ibuprofen unless you're told to. If you were given antibiotics, take them as told. Do not stop taking them even if you start to feel better. Shower on the morning of your procedure. Your provider may ask you to wash with a soap that kills germs. What happens during the procedure?  You may be given: Anesthesia. This keeps you from feeling pain. It will numb certain areas of your body. The cyst area will be cleaned. A small cut will be made over the cyst. The cyst will be separated from the surrounding tissues. If possible, the cyst will be taken out as a whole. If the cyst bursts, it will be taken out in pieces. Any bleeding will be controlled. The cut will be closed with stitches, if needed. Antibiotic ointment and a bandage may be put on the area. These steps may vary. Ask what you can expect. What happens after the procedure? If you were given  antibiotic medicine or ointment, use them for the time you were told. Do not stop using them sooner even if you start to feel better. You will need to reapply the enzyme ointment to the wound. Your provider will show you how to reapply the ointment and the dressing. This information is not intended to replace advice given to you by your health care provider. Make sure you discuss any questions you have with your health care provider. Document Revised: 06/26/2023 Document Reviewed: 06/26/2023 Elsevier Patient Education  2024 [employer].

## 2024-03-23 NOTE — Progress Notes (Signed)
   03/23/24 2:35 PM   Christian Santiago [birth year] [id number]  CC: Penile lesion, genital warts  HPI: 48 year old male who reports about 1 year of a bothersome spherical lesion on the right lateral aspect of the phallus.  This makes sexual activity difficult and uncomfortable.  He also had an ultrasound with PCP that suggested benign lesion with no blood flow.  He is interested in having this removed.  He has a few genital warts that he is also interested in treatment for.   PMH: Past Medical History:  Diagnosis Date   Asthma    Chronic diarrhea 12/28/2017   Migraines    Sinus disease    deviated septum    Surgical History: Past Surgical History:  Procedure Laterality Date   KNEE ARTHROSCOPY     bilateral, torn meniscus and torn quad   WRIST GANGLION EXCISION Right      Family History: Family History  Problem Relation Age of Onset   Thyroid disease Mother    Hypertension Father    Prostate cancer Neg Hx     Social History:  reports that he quit smoking about 3 years ago. His smoking use included cigarettes. He has never used smokeless tobacco. He reports current alcohol use. He reports that he does not use drugs.  Physical Exam: BP 126/85 (BP Location: Left Arm, Patient Position: Sitting, Cuff Size: Large)   Pulse 83   Ht 5\' 11"  (1.803 m)   Wt 204 lb (92.5 kg)   SpO2 98%   BMI 28.45 kg/m    Constitutional:  Alert and oriented, No acute distress. Cardiovascular: No clubbing, cyanosis, or edema. Respiratory: Normal respiratory effort, no increased work of breathing. GI: Abdomen is soft, nontender, nondistended, no abdominal masses GU: Phallus with patent meatus, 1 cm easily mobile sebaceous/epidermoid cyst right lateral shaft, small genital wart dorsal aspect of the penis, and suprapubic region   Assessment & Plan:   48 year old male with 1 cm sebaceous cyst on the right penile shaft that he is interested in having removed, as well as small genital warts.  He  desires treatment for the genital warts with imiquimod  cream, and if no improvement can be removed or cryoablated at the time of sebaceous cyst removal.  I think we can safely and comfortably do this in clinic under local anesthetic, risk and benefits discussed including bleeding, infection, scar tissue, recurrence.  Imiquimod  cream for genital warts Schedule clinic excision of penile shaft sebaceous cyst with local anesthetic  Jay Meth, MD 03/23/2024  [facility] [address], [address] [place], [place] [address] [phone]

## 2024-03-27 ENCOUNTER — Encounter: Payer: Self-pay | Admitting: Family Medicine

## 2024-03-27 DIAGNOSIS — W57XXXA Bitten or stung by nonvenomous insect and other nonvenomous arthropods, initial encounter: Secondary | ICD-10-CM | POA: Insufficient documentation

## 2024-03-27 NOTE — Assessment & Plan Note (Signed)
 Recent tick bites on ear and leg. No infection signs. Low risk Lyme disease, but prophylaxis considered due to exposure history. - Order Lyme disease antibody test. - Consider prophylactic doxycycline if Lyme disease test is positive. - Advised to monitor for infection signs or symptom worsening.

## 2024-03-29 LAB — COLOGUARD: COLOGUARD: NEGATIVE

## 2024-04-08 ENCOUNTER — Ambulatory Visit: Payer: Self-pay

## 2024-04-08 ENCOUNTER — Ambulatory Visit (INDEPENDENT_AMBULATORY_CARE_PROVIDER_SITE_OTHER)

## 2024-04-08 VITALS — BP 120/90 | HR 94 | Ht 71.0 in | Wt 203.2 lb

## 2024-04-08 DIAGNOSIS — L282 Other prurigo: Secondary | ICD-10-CM | POA: Diagnosis not present

## 2024-04-08 DIAGNOSIS — B353 Tinea pedis: Secondary | ICD-10-CM

## 2024-04-08 DIAGNOSIS — R233 Spontaneous ecchymoses: Secondary | ICD-10-CM | POA: Diagnosis not present

## 2024-04-08 LAB — CBC WITH DIFFERENTIAL/PLATELET
Basophils Absolute: 0 10*3/uL (ref 0.0–0.1)
Basophils Relative: 0.9 % (ref 0.0–3.0)
Eosinophils Absolute: 0.2 10*3/uL (ref 0.0–0.7)
Eosinophils Relative: 4.1 % (ref 0.0–5.0)
HCT: 47.1 % (ref 39.0–52.0)
Hemoglobin: 15.8 g/dL (ref 13.0–17.0)
Lymphocytes Relative: 46.4 % — ABNORMAL HIGH (ref 12.0–46.0)
Lymphs Abs: 2.4 10*3/uL (ref 0.7–4.0)
MCHC: 33.5 g/dL (ref 30.0–36.0)
MCV: 87.4 fl (ref 78.0–100.0)
Monocytes Absolute: 0.6 10*3/uL (ref 0.1–1.0)
Monocytes Relative: 11 % (ref 3.0–12.0)
Neutro Abs: 1.9 10*3/uL (ref 1.4–7.7)
Neutrophils Relative %: 37.6 % — ABNORMAL LOW (ref 43.0–77.0)
Platelets: 292 10*3/uL (ref 150.0–400.0)
RBC: 5.39 Mil/uL (ref 4.22–5.81)
RDW: 13.3 % (ref 11.5–15.5)
WBC: 5.2 10*3/uL (ref 4.0–10.5)

## 2024-04-08 LAB — HEPATIC FUNCTION PANEL
ALT: 90 U/L — ABNORMAL HIGH (ref 0–53)
AST: 53 U/L — ABNORMAL HIGH (ref 0–37)
Albumin: 4.4 g/dL (ref 3.5–5.2)
Alkaline Phosphatase: 71 U/L (ref 39–117)
Bilirubin, Direct: 0.1 mg/dL (ref 0.0–0.3)
Total Bilirubin: 0.4 mg/dL (ref 0.2–1.2)
Total Protein: 7.1 g/dL (ref 6.0–8.3)

## 2024-04-08 MED ORDER — PREDNISONE 20 MG PO TABS: 20.0000 mg | ORAL_TABLET | Freq: Every day | ORAL | 0 refills | Status: DC

## 2024-04-08 MED ORDER — KETOCONAZOLE 2 % EX CREA: 1.0000 | TOPICAL_CREAM | Freq: Every day | CUTANEOUS | 0 refills | Status: AC

## 2024-04-08 NOTE — Progress Notes (Signed)
 Talked to the patient and updated him on lab results. Mild change in neutrophils, lymphocytes with rest of the CBC being normal suggestive of benign.   Given elevated LFTs, counsel against treating patient with oral antifungal.   Recommend Prednisone  20 mg daily for 5 days and 2% Ketoconazole cream daily for 15-21 days.   Patient will be travelling to [place], next week. Recommend repeating LFTs once he is back. Recommend stop taking workout supplements that he recently started.  Jacklin Mascot, MD

## 2024-04-08 NOTE — Assessment & Plan Note (Signed)
 D/D includes tinea pedis, contact/allergic dermatitis, viral exanthema, poison ivy/oak.  We will get LFT, CBC. If both normal recommend oral antifungal and oral steroid for 1 week. Patient is travelling to [place] in one week thus choosing oral over topical treatment.

## 2024-04-08 NOTE — Progress Notes (Signed)
   Acute Office Visit  Subjective:     Patient ID: Christian Santiago, male    DOB: 07-03-1976, 48 y.o.   MRN: [id number]  Chief Complaint  Patient presents with   Rash    Patient is in today for evaluation of red rashes on soles and feet bilaterally ongoing for about 10 days. He reports intermittent itchiness. Rash started after wearing wet boots at work and using new socks. No associated swelling, pain, drainage. Denies recent illness, travel or known new exposures. Patient tried Lotrimin and Cetrizine which seems to help.       As per HPI    Objective:    BP (!) 120/90   Pulse 94   Ht 5\' 11"  (1.803 m)   Wt 203 lb 3.2 oz (92.2 kg)   SpO2 97%   BMI 28.34 kg/m    Physical Exam Cardiovascular:     Rate and Rhythm: Normal rate.  Pulmonary:     Effort: Pulmonary effort is normal.     Breath sounds: Normal breath sounds.  Musculoskeletal:     Right lower leg: No edema.     Left lower leg: No edema.  Feet:     Comments: Bilateral feet and soles with erythematous, petechial rashes dispersed, without obvious swelling, edema. Also has thickened first toenails b/l.  Neurological:     Mental Status: He is alert and oriented to person, place, and time.     No results found for any visits on 04/08/24.     Assessment & Plan:  Pleasant 48 year old male presenting for evaluation of rashes on both feet.   Petechial rash Assessment & Plan: D/D includes tinea pedis, contact/allergic dermatitis, viral exanthema, poison ivy/oak.  We will get LFT, CBC. If both normal recommend oral antifungal and oral steroid for 1 week. Patient is travelling to [place] in one week thus choosing oral over topical treatment.   Orders: -     CBC with Differential/Platelet -     Hepatic function panel  Pruritic rash Assessment & Plan: D/D includes tinea pedis, contact/allergic dermatitis, viral exanthema, poison ivy/oak.  We will get LFT, CBC. If both normal recommend oral antifungal and oral steroid  for 1 week. Patient is travelling to [place] in one week thus choosing oral over topical treatment.      Return if symptoms worsen or fail to improve.  Jacklin Mascot, MD

## 2024-05-26 ENCOUNTER — Ambulatory Visit: Admitting: Urology

## 2024-05-26 VITALS — BP 146/85 | HR 91 | Ht 71.0 in | Wt 203.0 lb

## 2024-05-26 DIAGNOSIS — L723 Sebaceous cyst: Secondary | ICD-10-CM | POA: Diagnosis not present

## 2024-05-26 NOTE — Patient Instructions (Signed)
 No sexual activity for at least 2 weeks, or until stitches have dissolved.  You can take Tylenol, Aleve, or ibuprofen for any discomfort or pain.  No swimming or tub baths for at least 2 weeks.  You can shower starting Saturday and wash the incision gently with soap and water.  Call the clinic if you have any problems.

## 2024-05-26 NOTE — Progress Notes (Signed)
    05/26/24  Indication: Sebaceous cyst of penile shaft  Urology minor procedure note:  Informed consent was obtained, and we discussed the risks of bleeding and infection. A time out was performed to ensure correct patient identity.  Procedure: Patient was prepped and draped in standard sterile fashion.  There is a 1.5 cm mobile sebaceous cyst on the ventral aspect of the mid phallus.  Local anesthetic was injected circumferential to the cyst.  A small 1 cm incision was made, and the sebaceous cyst was dissected free intact.  Meticulous hemostasis achieved with electrocautery.  Interrupted 3-0 chromic sutures used for closure.  Minimal blood loss.  Post-Procedure: - Patient tolerated the procedure well - He was counseled to seek immediate medical attention if experiences significant bleeding, fevers, or severe pain  Assessment/ Plan: Follow-up with urology as needed  Christian Burnet, MD 05/26/2024

## 2024-06-02 ENCOUNTER — Telehealth: Payer: Self-pay | Admitting: *Deleted

## 2024-06-02 NOTE — Telephone Encounter (Signed)
 Called pt back informed him of the information below. Pt voiced understanding however states he is concerned about the risk for infection due to sweating from working outdoors.   Advised pt on moisture wicking underwear, applying gauze to the wound and changing several times a day during his shift, advised pt to air wound out when possible at home. Pt advised on signs and sx of infection. Pt voiced understanding.

## 2024-06-02 NOTE — Telephone Encounter (Signed)
 Patient states he woke up yesterday afternoon  with blood covering his shorts with light red blood .  He states he wash the area . He states his suture are still intact . The area is not red but still swollen . No fever or chills. Patient just wants to make sure he does not need to come in . He states he woke this morning the with no drainage.

## 2024-08-22 ENCOUNTER — Encounter (HOSPITAL_COMMUNITY): Payer: Self-pay

## 2024-08-22 ENCOUNTER — Emergency Department (HOSPITAL_COMMUNITY)
Admission: EM | Admit: 2024-08-22 | Discharge: 2024-08-22 | Disposition: A | Discharge status: Home / Self Care | Attending: Emergency Medicine | Admitting: Emergency Medicine

## 2024-08-22 ENCOUNTER — Other Ambulatory Visit: Payer: Self-pay

## 2024-08-22 ENCOUNTER — Emergency Department (HOSPITAL_COMMUNITY)

## 2024-08-22 ENCOUNTER — Telehealth: Payer: Self-pay

## 2024-08-22 DIAGNOSIS — R202 Paresthesia of skin: Secondary | ICD-10-CM | POA: Insufficient documentation

## 2024-08-22 LAB — BASIC METABOLIC PANEL WITH GFR
Anion gap: 12 (ref 5–15)
BUN: 14 mg/dL (ref 6–20)
CO2: 24 mmol/L (ref 22–32)
Calcium: 9.3 mg/dL (ref 8.9–10.3)
Chloride: 99 mmol/L (ref 98–111)
Creatinine, Ser: 0.77 mg/dL (ref 0.61–1.24)
GFR, Estimated: >60 mL/min
Glucose, Bld: 93 mg/dL (ref 70–99)
Potassium: 4.2 mmol/L (ref 3.5–5.1)
Sodium: 135 mmol/L (ref 135–145)

## 2024-08-22 LAB — I-STAT CHEM 8, ED
BUN: 18 mg/dL (ref 6–20)
Calcium, Ion: 1.1 mmol/L — ABNORMAL LOW (ref 1.15–1.40)
Chloride: 105 mmol/L (ref 98–111)
Creatinine, Ser: 0.8 mg/dL (ref 0.61–1.24)
Glucose, Bld: 94 mg/dL (ref 70–99)
HCT: 49 % (ref 39.0–52.0)
Hemoglobin: 16.7 g/dL (ref 13.0–17.0)
Potassium: 4.2 mmol/L (ref 3.5–5.1)
Sodium: 139 mmol/L (ref 135–145)
TCO2: 25 mmol/L (ref 22–32)

## 2024-08-22 LAB — HEPATIC FUNCTION PANEL
ALT: 31 U/L (ref 0–44)
AST: 26 U/L (ref 15–41)
Albumin: 4.2 g/dL (ref 3.5–5.0)
Alkaline Phosphatase: 60 U/L (ref 38–126)
Bilirubin, Direct: 0.1 mg/dL (ref 0.0–0.2)
Indirect Bilirubin: 0.6 mg/dL (ref 0.3–0.9)
Total Bilirubin: 0.7 mg/dL (ref 0.0–1.2)
Total Protein: 7.6 g/dL (ref 6.5–8.1)

## 2024-08-22 LAB — CBC
HCT: 49.1 % (ref 39.0–52.0)
Hemoglobin: 16.4 g/dL (ref 13.0–17.0)
MCH: 29.7 pg (ref 26.0–34.0)
MCHC: 33.4 g/dL (ref 30.0–36.0)
MCV: 88.9 fL (ref 80.0–100.0)
Platelets: 288 10*3/uL (ref 150–400)
RBC: 5.52 MIL/uL (ref 4.22–5.81)
RDW: 12.2 % (ref 11.5–15.5)
WBC: 7.2 10*3/uL (ref 4.0–10.5)
nRBC: 0 % (ref 0.0–0.2)

## 2024-08-22 LAB — DIFFERENTIAL
Abs Immature Granulocytes: 0.01 K/uL (ref 0.00–0.07)
Basophils Absolute: 0.1 K/uL (ref 0.0–0.1)
Basophils Relative: 1 %
Eosinophils Absolute: 0.1 K/uL (ref 0.0–0.5)
Eosinophils Relative: 1 %
Immature Granulocytes: 0 %
Lymphocytes Relative: 32 %
Lymphs Abs: 2.3 K/uL (ref 0.7–4.0)
Monocytes Absolute: 0.6 K/uL (ref 0.1–1.0)
Monocytes Relative: 8 %
Neutro Abs: 4.1 K/uL (ref 1.7–7.7)
Neutrophils Relative %: 58 %

## 2024-08-22 LAB — TROPONIN I (HIGH SENSITIVITY): Troponin I (High Sensitivity): 3 ng/L (ref <18)

## 2024-08-22 LAB — PROTIME-INR
INR: 1 (ref 0.8–1.2)
Prothrombin Time: 13.5 s (ref 11.4–15.2)

## 2024-08-22 LAB — ETHANOL: Alcohol, Ethyl (B): <15 mg/dL (ref <15)

## 2024-08-22 LAB — APTT: aPTT: 27 s (ref 24–36)

## 2024-08-22 MED ORDER — IOHEXOL 350 MG/ML SOLN
75.0000 mL | Freq: Once | INTRAVENOUS | Status: AC | PRN
Start: 2024-08-22 — End: 2024-08-22
  Administered 2024-08-22: 75 mL via INTRAVENOUS

## 2024-08-22 NOTE — Telephone Encounter (Signed)
 From MyChart:   All Conversations: Appointment scheduled from MyChart [facility])            Robert Scifres to [employer] Admin SM    08/22/24  7:27 AM Appointment for: Christian Santiago ([id number]) Visit type: OFFICE VISIT (8002) 09/19/2024 8:00 AM (30 minutes) with Luke Shade in [facility]   Patient comments: Tingling in face

## 2024-08-22 NOTE — ED Provider Notes (Signed)
 [place] [facility]GENC[facility] DEPARTMENT AT [facility] Provider Note   CSN: [id number] Arrival date & time: 08/22/24  1144     Patient presents with: Numbness   Christian Santiago is a 48 y.o. male.   48 yo M with a chief complaints of left-sided numbness.  He describes this as a tingling sensation occurred to the left side of his face his left arm and then into his left lateral calf.  He said this has come and gone.  It is currently resolved.  He denies any injury to his head or his neck.  Has never had a thing like this happen to him before.  Later in taking history he says he just seen a cardiologist and they told him he was having panic attacks.  Was having lower jaw tingling while he was playing videogames and then developed pressure in his chest.          Prior to Admission medications   Medication Sig Start Date End Date Taking? Authorizing Provider  fluticasone  (FLONASE ) 50 MCG/ACT nasal spray Place 2 sprays into the nose daily. 08/25/13   Vannie Delon LABOR, MD  imiquimod  (ALDARA ) 5 % cream Apply topically 3 (three) times a week. Apply at bedtime and leave in place for 6-10 hours 03/23/24   Francisca Redell BROCKS, MD  predniSONE  (DELTASONE ) 20 MG tablet Take 1 tablet (20 mg total) by mouth daily with breakfast. 04/08/24   Bair, Kalpana, MD    Allergies: Patient has no known allergies.    Review of Systems  Updated Vital Signs BP 135/84 (BP Location: Left Arm)   Pulse 74   Temp 98.1 F (36.7 C) (Oral)   Resp 17   Ht 5' 11 (1.803 m)   Wt 91.2 kg   SpO2 100%   BMI 28.03 kg/m   Physical Exam Vitals and nursing note reviewed.  Constitutional:      Appearance: He is well-developed.  HENT:     Head: Normocephalic and atraumatic.  Eyes:     Pupils: Pupils are equal, round, and reactive to light.  Neck:     Vascular: No JVD.  Cardiovascular:     Rate and Rhythm: Normal rate and regular rhythm.     Heart sounds: No murmur heard.    No friction rub. No gallop.   Pulmonary:     Effort: No respiratory distress.     Breath sounds: No wheezing.  Abdominal:     General: There is no distension.     Tenderness: There is no abdominal tenderness. There is no guarding or rebound.  Musculoskeletal:        General: Normal range of motion.     Cervical back: Normal range of motion and neck supple.     Comments: No obvious midline spinal tenderness step-offs or deformities.  Pulse motor and sensation intact to the left upper and left lower extremity.  No appreciable weakness.  No rash.  Negative Spurling's test.  Skin:    Coloration: Skin is not pale.     Findings: No rash.  Neurological:     Mental Status: He is alert and oriented to person, place, and time.  Psychiatric:        Behavior: Behavior normal.     (all labs ordered are listed, but only abnormal results are displayed) Labs Reviewed  I-STAT CHEM 8, ED - Abnormal; Notable for the following components:      Result Value   Calcium, Ion 1.10 (*)  All other components within normal limits  BASIC METABOLIC PANEL WITH GFR  CBC  PROTIME-INR  APTT  ETHANOL  HEPATIC FUNCTION PANEL  DIFFERENTIAL  TROPONIN I (HIGH SENSITIVITY)  TROPONIN I (HIGH SENSITIVITY)    EKG: None  Radiology: CT ANGIO HEAD NECK W WO CM Result Date: 08/22/2024 EXAM: CTA HEAD AND NECK WITH AND WITHOUT 08/22/2024 02:10:00 PM TECHNIQUE: CTA of the head and neck was performed with and without the administration of 75 mL of iohexol  (OMNIPAQUE ) 350 MG/ML injection. Multiplanar 2D and/or 3D reformatted images are provided for review. Automated exposure control, iterative reconstruction, and/or weight based adjustment of the mA/kV was utilized to reduce the radiation dose to as low as reasonably achievable. Stenosis of the internal carotid arteries measured using NASCET criteria. COMPARISON: None available CLINICAL HISTORY: Neuro deficit, acute, stroke suspected. Christian Santiago, a 48 y.o. male, was evaluated in triage. Pt  complains of left-sided facial and upper extremity paresthesias. Patient has a history of hypertension, high cholesterol (untreated), smoking history quit 1 year ago. Patient presents with these symptoms which started 2 days ago. They have been intermittent, improved temporarily yesterday, but returned this morning. Patient denies signs of stroke including: facial droop, slurred speech, aphasia, weakness in extremities, imbalance/trouble walking. FINDINGS: CTA NECK: AORTIC ARCH AND ARCH VESSELS: No dissection or arterial injury. No significant stenosis of the brachiocephalic or subclavian arteries. CERVICAL CAROTID ARTERIES: Mild atherosclerosis at the carotid bifurcations without hemodynamically significant stenosis. No dissection or arterial injury. CERVICAL VERTEBRAL ARTERIES: The left vertebral artery is dominant. No dissection, arterial injury, or significant stenosis. LUNGS AND MEDIASTINUM: Unremarkable. SOFT TISSUES: No acute abnormality. BONES: No acute abnormality. CTA HEAD: ANTERIOR CIRCULATION: Minimal atherosclerosis of the left cavernous ICA without stenosis. No significant stenosis of the internal carotid arteries. No significant stenosis of the anterior cerebral arteries. No significant stenosis of the middle cerebral arteries. No aneurysm. POSTERIOR CIRCULATION: No significant stenosis of the posterior cerebral arteries. No significant stenosis of the basilar artery. No significant stenosis of the vertebral arteries. No aneurysm. OTHER: No acute intracranial hemorrhage, no edema, mass effect, or midline shift. The basilar cisterns are patent. Ventricles are unremarkable. No dural venous sinus thrombosis on this non-dedicated study. IMPRESSION: 1. No acute intracranial findings. 2. No large vessel occlusion. 3. Mild atherosclerosis at the carotid bifurcations without hemodynamically significant stenosis. 4. Minimal atherosclerosis of the left cavernous ICA without stenosis. Electronically signed by:  Donnice Mania MD 08/22/2024 03:50 PM EDT RP Workstation: [id number]   DG Chest 2 View Result Date: 08/22/2024 CLINICAL DATA:  pain radiation. EXAM: CHEST - 2 VIEW COMPARISON:  02/06/2023. FINDINGS: Bilateral lung fields are clear. Bilateral costophrenic angles are clear. Normal cardio-mediastinal silhouette. No acute osseous abnormalities. The soft tissues are within normal limits. IMPRESSION: No active cardiopulmonary disease. Electronically Signed   By: Ree Molt M.D.   On: 08/22/2024 13:19     Procedures   Medications Ordered in the ED  iohexol  (OMNIPAQUE ) 350 MG/ML injection 75 mL (75 mLs Intravenous Contrast Given 08/22/24 1412)                                    Medical Decision Making Amount and/or Complexity of Data Reviewed Labs: ordered. Radiology: ordered.   48 yo M with a chief complaint of left-sided tingling.  Going on for a couple days.  Seem to come and go.  A bit worse today than it has been.  Seen in the MSE process.  Plan for CT angio head and neck blood work.  CT angiogram head and neck negative for dissection or other acute finding.  I discussed results with the patient.  I did offer MRI imaging, currently declining.  Will follow-up with neurology as an outpatient.  4:05 PM:  I have discussed the diagnosis/risks/treatment options with the patient.  Evaluation and diagnostic testing in the emergency department does not suggest an emergent condition requiring admission or immediate intervention beyond what has been performed at this time.  They will follow up with Neuro, PCP. We also discussed returning to the ED immediately if new or worsening sx occur. We discussed the sx which are most concerning (e.g., sudden worsening pain, fever, inability to tolerate by mouth, stroke s/sx) that necessitate immediate return. Medications administered to the patient during their visit and any new prescriptions provided to the patient are listed below.  Medications given during  this visit Medications  iohexol  (OMNIPAQUE ) 350 MG/ML injection 75 mL (75 mLs Intravenous Contrast Given 08/22/24 1412)     The patient appears reasonably screen and/or stabilized for discharge and I doubt any other medical condition or other [facility] requiring further screening, evaluation, or treatment in the ED at this time prior to discharge.       Final diagnoses:  Paresthesia    ED Discharge Orders          Ordered    Ambulatory referral to Neurology       Comments: L sided symptoms?   08/22/24 1604               Emil Share, DO 08/22/24 1605

## 2024-08-22 NOTE — ED Triage Notes (Addendum)
 Pt came in via POV d/t Lt arm numbness & Lt sided facial numbness as well that started 2 days ago & then went away. States it started again this morning. Also reports that he is having intermittent muscle spasms on his Lt arm & Lt leg & Lt ankle pain the last 2 days as well. A/Ox4, denies any acute pain but will feels a 2/10 pain on his Lt ankle & Lt arm & Lt shoulder at times. No facial droop noted, no unilateral extremity weakness assessed, only decreased sensation to Lt face & Lt hand persist during triage.

## 2024-08-22 NOTE — Telephone Encounter (Signed)
 See other telephone encounter in Chart.

## 2024-08-22 NOTE — Telephone Encounter (Signed)
 Received a note from front desk staff to call patient in regards to an appointment made via MyChart for Tingling in Face. Spoke with patient to gather more information. Patient states he discussed this issue with Dr Abbey back at his May appointment after a Tick Bite. Patient states the issue is the same, but seems to be worsening except no drooping in the face. Patient states he also has started to experience muscle spasms in his bicep area and lower left leg and he only notices it more in the mornings. Patient states yesterday he did start to feel a bit clammy. Patient was transferred to Dr Abbey to discuss recommendations with patient. Patient states he will wait until his scheduled appointment in October, but was advised to go to ER if symptoms worsening or he is having heart attack symptoms. Verbalized understanding.

## 2024-08-22 NOTE — ED Triage Notes (Signed)
 Pt presents with left arm and facial numbness starting this AM. Pt says head felt cloudy.  Episode of clamminess and numbness on face and arm on Saturday but went away. Pt has been seen by cardiology but was cleared. States he takes baby aspirin daily as preventative measure.

## 2024-08-22 NOTE — ED Provider Triage Note (Signed)
 Emergency Medicine Provider Triage Evaluation Note  Christian Santiago , a 48 y.o. male  was evaluated in triage.  Pt complains of left-sided facial and upper extremity paresthesias.  Patient has a history of hypertension, high cholesterol (untreated), smoking history quit 1 year ago.  Patient presents with these symptoms which started 2 days ago.  They have been intermittent, improved temporarily yesterday, but returned this morning. Patient denies signs of stroke including: facial droop, slurred speech, aphasia, weakness in extremities, imbalance/trouble walking.   Review of Systems  Positive: Left-sided arm and facial paresthesias, bilateral calf pain Negative: Vision change or loss  Physical Exam  BP (!) 169/96   Pulse 95   Temp 98.4 F (36.9 C)   Resp 14   Ht 5' 11 (1.803 m)   Wt 91.2 kg   SpO2 100%   BMI 28.03 kg/m  Gen:   Awake, no distress   Resp:  Normal effort  MSK:   Moves extremities without difficulty  Other:  Extremity strength 5/5, no cranial nerve deficit observed  Medical Decision Making  Medically screening exam initiated at 12:30 PM.  Appropriate orders placed.  Christian Santiago was informed that the remainder of the evaluation will be completed by another provider, this initial triage assessment does not replace that evaluation, and the importance of remaining in the ED until their evaluation is complete.  Patient with unilateral paresthesias.  He does have some risk factors.  Will evaluate for stroke and also add troponin.  No code stroke due to only minor sensory symptoms at this time.  Will evaluate head and neck vasculature with CTA head and neck due to concern for CVA/TIA.    Christian Chew, PA-C 08/22/24 1233

## 2024-08-22 NOTE — ED Notes (Signed)
 Patient transported to X-ray

## 2024-08-29 NOTE — Progress Notes (Unsigned)
 [facility]  PATIENT: Christian Santiago DOB: 1976/06/16  REFERRING DOCTOR OR PCP: Luke Shade, MD SOURCE: Patient, note from primary care and emergency room, imaging and lab reports, imaging studies personally reviewed.  _________________________________   HISTORICAL  CHIEF COMPLAINT:  Chief Complaint  Patient presents with   New Patient (Initial Visit)    Pt in room 10. Alone. internal referral for Paresthesia.    HISTORY OF PRESENT ILLNESS:  I had the pleasure of seeing your patient , Christian Santiago, at [facility] for neurologic consultation regarding his numbness and paresthesias.  He is a 48 year old man who presented to the emergency room on 08/22/2024 with left-sided numbness.  This was a tingling sensation that involves the face, arm this lasted a couple hours and was improved by the time he was evaluated. He never had weakness.    In the emergency room he had a CT scan of the head and a CT angiogram that was noncontributory.   The entire episode lasted abput 5 hours with moderate numbness and still has some tingling on the left face off/on.  He also notes muscle spasms in the left arm (triceps/biceps).     He had a tick bite earlier this year.   Lyme titer was negative  He sometimes has pain/pressure in the occiput.  He has had an occipital HA this weekend.     Between age [age] he used a lot of Ecstasy and some LSD.   He never had neurologic symptoms at the time.    He denies much anxiety though he states other people think he is.    He saw cardiology last year due to jaw pain and concern about MI x 2 while playing video game. .    Vascular risk:  Tobacco x 35 years (stopped 2024), HLD.   No DM, occasional 'white coat HTN'   Imaging personally reviewed.: CT scan of the head 08/22/2024 was normal.  CT angiogram of the head and neck was normal for age with mild atherosclerosis at the carotid bifurcations.  This was not  hemodynamically significant.  REVIEW OF SYSTEMS: Constitutional: No fevers, chills, sweats, or change in appetite Eyes: No visual changes, double vision, eye pain Ear, nose and throat: No hearing loss, ear pain, nasal congestion, sore throat Cardiovascular: No chest pain, palpitations Respiratory:  No shortness of breath at rest or with exertion.   No wheezes GastrointestinaI: No nausea, vomiting, diarrhea, abdominal pain, fecal incontinence Genitourinary:  No dysuria, urinary retention or frequency.  No nocturia. Musculoskeletal:  No neck pain, back pain Integumentary: No rash, pruritus, skin lesions Neurological: as above Psychiatric: No depression at this time.  Mild anxiety Endocrine: No palpitations, diaphoresis, change in appetite, change in weigh or increased thirst Hematologic/Lymphatic:  No anemia, purpura, petechiae. Allergic/Immunologic: No itchy/runny eyes, nasal congestion, recent allergic reactions, rashes  ALLERGIES: No Known Allergies  HOME MEDICATIONS:  Current Outpatient Medications:    fluticasone  (FLONASE ) 50 MCG/ACT nasal spray, Place 2 sprays into the nose daily., Disp: 16 g, Rfl: 6   predniSONE  (DELTASONE ) 20 MG tablet, Take 1 tablet (20 mg total) by mouth daily with breakfast., Disp: 5 tablet, Rfl: 0  PAST MEDICAL HISTORY: Past Medical History:  Diagnosis Date   Asthma    Chronic diarrhea 12/28/2017   Migraines    Sinus disease    deviated septum    PAST SURGICAL HISTORY: Past Surgical History:  Procedure Laterality Date   KNEE ARTHROSCOPY     bilateral, torn meniscus  and torn quad   WRIST GANGLION EXCISION Right     FAMILY HISTORY: Family History  Problem Relation Age of Onset   Thyroid disease Mother    Hypertension Father    Prostate cancer Neg Hx     SOCIAL HISTORY: Social History   Socioeconomic History   Marital status: Married    Spouse name: Not on file   Number of children: Not on file   Years of education: Not on file    Highest education level: Not on file  Occupational History   Not on file  Tobacco Use   Smoking status: Former    Current packs/day: 0.00    Types: Cigarettes    Quit date: 01/2021    Years since quitting: 3.6   Smokeless tobacco: Never  Vaping Use   Vaping status: Some Days  Substance and Sexual Activity   Alcohol use: Yes    Comment: wine on weekends   Drug use: No   Sexual activity: Yes  Other Topics Concern   Not on file  Social History Narrative   Lives in [place] alone. No pets      Work - Immunologist work      Diet - regular      Exercise - limited, very active at work   Social Drivers of Corporate investment banker Strain: Not on [employer] Insecurity: Not on file  Transportation Needs: Not on file  Physical Activity: Not on file  Stress: Not on file (10/03/2023)  Social Connections: Not on file  Intimate Partner Violence: Not on file       PHYSICAL EXAM  Vitals:   08/30/24 1021 08/30/24 1025  BP: (!) 161/96 (!) 145/96  Pulse: (!) 106   Weight: 209 lb 8 oz (95 kg)   Height: 5' 11 (1.803 m)     Body mass index is 29.22 kg/m.   General: The patient is well-developed and well-nourished and in no acute distress  HEENT:  Head is [place]/AT.  Sclera are anicteric.    Neck: No carotid bruits are noted.  The neck is nontender.  Cardiovascular: The heart has a regular rate and rhythm with a normal S1 and S2. There were no murmurs, gallops or rubs.    Skin: Extremities are without rash or  edema.  Musculoskeletal:  Back is nontender  Neurologic Exam  Mental status: The patient is alert and oriented x 3 at the time of the examination. The patient has apparent normal recent and remote memory, with an apparently normal attention span and concentration ability.   Speech is normal.  Cranial nerves: Extraocular movements are full. Pupils are equal, round, and reactive to light and accomodation.  Visual fields are full.  Facial symmetry is present.  There is good facial sensation to soft touch bilaterally.Facial strength is normal.  Trapezius and sternocleidomastoid strength is normal. No dysarthria is noted.  The tongue is midline, and the patient has symmetric elevation of the soft palate. No obvious hearing deficits are noted.  Motor:  Muscle bulk is normal.   Tone is normal. Strength is  5 / 5 in all 4 extremities.   Sensory: Sensory testing is intact to pinprick, soft touch and vibration sensation in all 4 extremities.  Coordination: Cerebellar testing reveals good finger-nose-finger and heel-to-shin bilaterally.  Gait and station: Station is normal.   Gait is normal. Tandem gait is normal. Romberg is negative.   Reflexes: Deep tendon reflexes are symmetric and normal in the  arms but increased at the knees with spread.  The ankles were 3.  No ankle clonus..   Plantar responses are flexor.    DIAGNOSTIC DATA (LABS, IMAGING, TESTING) - I reviewed patient records, labs, notes, testing and imaging myself where available.  Lab Results  Component Value Date   WBC 7.2 08/22/2024   HGB 16.7 08/22/2024   HCT 49.0 08/22/2024   MCV 88.9 08/22/2024   PLT 288 08/22/2024      Component Value Date/Time   NA 139 08/22/2024 1248   K 4.2 08/22/2024 1248   CL 105 08/22/2024 1248   CO2 24 08/22/2024 1231   GLUCOSE 94 08/22/2024 1248   BUN 18 08/22/2024 1248   CREATININE 0.80 08/22/2024 1248   CALCIUM 9.3 08/22/2024 1231   PROT 7.6 08/22/2024 1231   ALBUMIN 4.2 08/22/2024 1231   AST 26 08/22/2024 1231   ALT 31 08/22/2024 1231   ALKPHOS 60 08/22/2024 1231   BILITOT 0.7 08/22/2024 1231   GFRNONAA >60 08/22/2024 1231   GFRAA >60 09/30/2018 0456   Lab Results  Component Value Date   CHOL 218 (H) 03/16/2024   HDL 42.30 03/16/2024   LDLCALC 150 (H) 03/16/2024   TRIG 129.0 03/16/2024   CHOLHDL 5 03/16/2024   Lab Results  Component Value Date   HGBA1C 5.7 03/16/2024   No results found for: VITAMINB12 Lab Results  Component  Value Date   TSH 0.95 03/16/2024       ASSESSMENT AND PLAN  Numbness and tingling - Plan: MR BRAIN W WO CONTRAST, MR CERVICAL SPINE W WO CONTRAST  Occipital headache - Plan: MR BRAIN W WO CONTRAST, MR CERVICAL SPINE W WO CONTRAST  Hyperreflexia - Plan: MR BRAIN W WO CONTRAST, MR CERVICAL SPINE W WO CONTRAST  Hyperlipidemia, unspecified hyperlipidemia type  In summary, Christian Santiago is a 48 year old man who had a 5-hour episode of left-sided numbness a couple weeks ago and continues to have intermittent numbness.  On physical examination, sensation was fine today.  However, he had hyperreflexia in the legs.  He has some tenderness at the occiput.  CT scan of the head recently was normal.  However, the CT angiogram did show some atherosclerosis at the bifurcation of the internal carotid arteries.  Of note, he does have hyperlipidemia and used to smoke.  He is not currently on a statin.  To further evaluate the numbness and hyperreflexia we will check MRI of the brain and cervical spine to assess for ischemic change, demyelination and myelopathy.  Based on the results, further evaluation, referral and/or treatment may be necessary. Due to the atherosclerosis seen on the CT angiogram and his hyperlipidemia, I sent in a prescription for rosuvastatin 10 mg.  He is also advised to continue to be tobacco free Stay active and exercise as tolerated. Will return to see me as needed based on the results of the studies.  He is advised to call if he has significant new or worsening neurologic symptoms.  You for asking me to see this patient.  Please let me know if I would be of further assistance with him or other patients in the future.   Semya Klinke A. Vear, MD, [facility] 08/30/2024, 11:17 AM Certified in Neurology, Clinical Neurophysiology, Sleep Medicine and Neuroimaging  [facility] [address] [place], [place] [address] [phone]

## 2024-08-30 ENCOUNTER — Encounter: Payer: Self-pay | Admitting: Neurology

## 2024-08-30 ENCOUNTER — Ambulatory Visit: Admitting: Neurology

## 2024-08-30 VITALS — BP 145/96 | HR 106 | Ht 71.0 in | Wt 209.5 lb

## 2024-08-30 DIAGNOSIS — R202 Paresthesia of skin: Secondary | ICD-10-CM

## 2024-08-30 DIAGNOSIS — R519 Headache, unspecified: Secondary | ICD-10-CM | POA: Diagnosis not present

## 2024-08-30 DIAGNOSIS — R292 Abnormal reflex: Secondary | ICD-10-CM

## 2024-08-30 DIAGNOSIS — E785 Hyperlipidemia, unspecified: Secondary | ICD-10-CM

## 2024-08-30 DIAGNOSIS — R200 Anesthesia of skin: Secondary | ICD-10-CM

## 2024-08-30 MED ORDER — ROSUVASTATIN CALCIUM 10 MG PO TABS: 10.0000 mg | ORAL_TABLET | Freq: Every day | ORAL | 11 refills | Status: AC

## 2024-09-07 ENCOUNTER — Ambulatory Visit

## 2024-09-07 ENCOUNTER — Ambulatory Visit: Payer: Self-pay | Admitting: Neurology

## 2024-09-07 DIAGNOSIS — R200 Anesthesia of skin: Secondary | ICD-10-CM

## 2024-09-07 DIAGNOSIS — R519 Headache, unspecified: Secondary | ICD-10-CM

## 2024-09-07 DIAGNOSIS — R202 Paresthesia of skin: Secondary | ICD-10-CM

## 2024-09-07 DIAGNOSIS — R292 Abnormal reflex: Secondary | ICD-10-CM

## 2024-09-07 MED ORDER — GADOBENATE DIMEGLUMINE 529 MG/ML IV SOLN
20.0000 mL | Freq: Once | INTRAVENOUS | Status: AC | PRN
  Administered 2024-09-07: 20 mL via INTRAVENOUS

## 2024-09-19 ENCOUNTER — Ambulatory Visit: Payer: Self-pay

## 2024-09-19 ENCOUNTER — Ambulatory Visit

## 2024-09-19 DIAGNOSIS — I6523 Occlusion and stenosis of bilateral carotid arteries: Secondary | ICD-10-CM

## 2024-09-19 DIAGNOSIS — R202 Paresthesia of skin: Secondary | ICD-10-CM | POA: Diagnosis not present

## 2024-09-19 DIAGNOSIS — M50322 Other cervical disc degeneration at C5-C6 level: Secondary | ICD-10-CM | POA: Diagnosis not present

## 2024-09-19 DIAGNOSIS — R030 Elevated blood-pressure reading, without diagnosis of hypertension: Secondary | ICD-10-CM

## 2024-09-19 LAB — COMPREHENSIVE METABOLIC PANEL WITH GFR
ALT: 34 U/L (ref 0–53)
AST: 19 U/L (ref 0–37)
Albumin: 4.5 g/dL (ref 3.5–5.2)
Alkaline Phosphatase: 70 U/L (ref 39–117)
BUN: 23 mg/dL (ref 6–23)
CO2: 27 meq/L (ref 19–32)
Calcium: 9.1 mg/dL (ref 8.4–10.5)
Chloride: 102 meq/L (ref 96–112)
Creatinine, Ser: 0.74 mg/dL (ref 0.40–1.50)
GFR: 106.03 mL/min (ref >60.00)
Glucose, Bld: 97 mg/dL (ref 70–99)
Potassium: 4.4 meq/L (ref 3.5–5.1)
Sodium: 138 meq/L (ref 135–145)
Total Bilirubin: 0.4 mg/dL (ref 0.2–1.2)
Total Protein: 7.2 g/dL (ref 6.0–8.3)

## 2024-09-19 NOTE — Assessment & Plan Note (Signed)
 Evident at ED on 08/22/24. BP within goal today. Patient recommended to check BP at home 2-3 times a week, goal BP <130/80 mmHg. If home BP above goal we will consider lifestyle modifications and pharmacological interventions.

## 2024-09-19 NOTE — Assessment & Plan Note (Signed)
 08/22/24 CT angio head and neck reviewed:  -  Mild atherosclerosis at the carotid bifurcations without hemodynamically significant stenosis. -  Minimal atherosclerosis of the left cavernous ICA without stenosis.   Continue Rosuvastatin 10 mg daily. Check CMP today. We also discussed continuing mediterranean diet, 30 min/day or 150 min per week moderate intensity exercise, BP control, healthy life style to reduce risk of stroke discussed

## 2024-09-19 NOTE — Patient Instructions (Addendum)
 Check BP 3 times a week at different time of the day. Keep log of BP, ideal goal BP is less than 130/80 mmHg.  Keep records of what you ate/drank, how many hours of sleep you were getting if you develop numbness on face.  Follow up in 4 months.  I will update you once I have the lab results.

## 2024-09-19 NOTE — Progress Notes (Signed)
 Established Patient Office Visit   Subjective  Patient ID: Christian Santiago, male    DOB: May 06, 1976  Age: 48 y.o. MRN: [id number]  Chief Complaint  Patient presents with   Numbness    He  has a past medical history of Asthma, Chronic diarrhea (12/28/2017), Contact dermatitis (08/07/2014), Migraines, and Sinus disease.  HPI Discussed the use of AI scribe software for clinical note transcription with the patient, who gave verbal consent to proceed.  History of Present Illness Christian Santiago is a 48 year old male who presents for a follow up of left-sided tingling and numbness and ED follow up from 08/22/24.   He experiences intermittent tingling and numbness primarily on the left side, affecting his fingers, arm, and occasionally his feet. Muscle spasms occur in specific areas. There are episodes of numbness in the right arm as well, but without loss of strength. The sensation is described as numbness and less sensation rather than pain. No facial droop, chest pain, or significant weakness.  He was evaluated in the emergency department where a CT scan of the head and neck was performed, and blood pressure was elevated at 167/97 mmHg. Blood work, troponin, was normal. An MRI was later performed by a neurologist, who explained it was to look for possible causes such as stroke, multiple sclerosis, or a tumor.  He has a history of elevated cholesterol and was started on a low-dose statin after a CT scan revealed mild cholesterol plaque formation. He has a history of smoking for over 30 years, which he has since quit. He also has a history of panic attacks, which have previously been mistaken for heart attacks. He mentions a low calcium level noted in previous blood work, which can cause muscle twitching and weakness. He has a history of optical migraines but has not experienced one recently.  He consumes caffeine, having reduced his intake from three energy drinks a day to one, to avoid  withdrawal headaches. His diet is generally Mediterranean, but work demands sometimes lead to irregular eating patterns. He is on call this week and anticipates working long hours, which may affect his meal regularity. He drinks alcohol occasionally, with the last consumption being a mixed drink the previous night. He has a blood pressure cuff at home and is able to monitor his blood pressure regularly. Stress can elevate his blood pressure, as evidenced by the elevated reading in the ER, which later normalized.  ROS As per HPI    Objective:     BP 110/76 (BP Location: Right Arm, Patient Position: Sitting, Cuff Size: Normal)   Pulse 94   Temp 98.2 F (36.8 C) (Oral)   Ht 5' 11 (1.803 m)   Wt 209 lb 12.8 oz (95.2 kg)   SpO2 96%   BMI 29.26 kg/m      09/19/2024    8:10 AM 04/08/2024    8:11 AM 03/21/2024    4:21 PM  Depression screen PHQ 2/9  Decreased Interest 0 0 0  Down, Depressed, Hopeless 0 0 0  PHQ - 2 Score 0 0 0  Altered sleeping 0 0 0  Tired, decreased energy 0 0 0  Change in appetite 0 0 0  Feeling bad or failure about yourself  0 0 0  Trouble concentrating 0 0 0  Moving slowly or fidgety/restless 0 0 0  Suicidal thoughts 0 0 0  PHQ-9 Score 0 0 0  Difficult doing work/chores Not difficult at all Not difficult at all Not  difficult at all      09/19/2024    8:10 AM 04/08/2024    8:11 AM 03/21/2024    4:22 PM 03/03/2024    9:15 AM  GAD 7 : Generalized Anxiety Score  Nervous, Anxious, on Edge 0 0 0 0  Control/stop worrying 0 0 0 0  Worry too much - different things 0 0 0 0  Trouble relaxing 0 0 0 0  Restless 0 0 0 0  Easily annoyed or irritable 0 0 0 0  Afraid - awful might happen 0 0 0 0  Total GAD 7 Score 0 0 0 0  Anxiety Difficulty Not difficult at all Not difficult at all Not difficult at all       09/19/2024    8:10 AM 04/08/2024    8:11 AM 03/21/2024    4:21 PM  Depression screen PHQ 2/9  Decreased Interest 0 0 0  Down, Depressed, Hopeless 0 0 0  PHQ  - 2 Score 0 0 0  Altered sleeping 0 0 0  Tired, decreased energy 0 0 0  Change in appetite 0 0 0  Feeling bad or failure about yourself  0 0 0  Trouble concentrating 0 0 0  Moving slowly or fidgety/restless 0 0 0  Suicidal thoughts 0 0 0  PHQ-9 Score 0 0 0  Difficult doing work/chores Not difficult at all Not difficult at all Not difficult at all      09/19/2024    8:10 AM 04/08/2024    8:11 AM 03/21/2024    4:22 PM 03/03/2024    9:15 AM  GAD 7 : Generalized Anxiety Score  Nervous, Anxious, on Edge 0 0 0 0  Control/stop worrying 0 0 0 0  Worry too much - different things 0 0 0 0  Trouble relaxing 0 0 0 0  Restless 0 0 0 0  Easily annoyed or irritable 0 0 0 0  Afraid - awful might happen 0 0 0 0  Total GAD 7 Score 0 0 0 0  Anxiety Difficulty Not difficult at all Not difficult at all Not difficult at all    SDOH Screenings   Depression (PHQ2-9): Low Risk  (09/19/2024)  Tobacco Use: Medium Risk (09/19/2024)     Physical Exam Constitutional:      General: He is not in acute distress.    Appearance: Normal appearance.  HENT:     Head: Normocephalic and atraumatic.     Mouth/Throat:     Mouth: Mucous membranes are moist.  Eyes:     Conjunctiva/sclera: Conjunctivae normal.  Neck:     Thyroid: No thyroid mass or thyroid tenderness.  Cardiovascular:     Rate and Rhythm: Normal rate and regular rhythm.  Pulmonary:     Effort: Pulmonary effort is normal.     Breath sounds: Normal breath sounds. No wheezing.  Abdominal:     General: Bowel sounds are normal.     Palpations: Abdomen is soft.     Tenderness: There is no abdominal tenderness. There is no guarding.  Musculoskeletal:     Cervical back: Neck supple. No rigidity.     Right lower leg: No edema.     Left lower leg: No edema.  Skin:    General: Skin is warm.     Coloration: Skin is not jaundiced.  Neurological:     Mental Status: He is alert and oriented to person, place, and time.  Psychiatric:        Mood  and Affect: Mood normal.        Behavior: Behavior normal.        Thought Content: Thought content normal.        No results found for any visits on 09/19/24.  The 10-year ASCVD risk score (Arnett DK, et al., 2019) is: 3.3%     Assessment & Plan:   Hypocalcemia Assessment & Plan: Evident during his ED visit on 08/22/24. This could be contributing to cramping, tinglings. Check CMP, management pending results.   Orders: -     Comprehensive metabolic panel with GFR  Mild atherosclerosis of carotid artery, bilateral Assessment & Plan: 08/22/24 CT angio head and neck reviewed:  -  Mild atherosclerosis at the carotid bifurcations without hemodynamically significant stenosis. -  Minimal atherosclerosis of the left cavernous ICA without stenosis.   Continue Rosuvastatin 10 mg daily. Check CMP today. We also discussed continuing mediterranean diet, 30 min/day or 150 min per week moderate intensity exercise, BP control, healthy life style to reduce risk of stroke discussed   Degeneration of intervertebral disc at C5-C6 level Assessment & Plan: Based on MRI cervical spine from 09/07/24. Consider this as one of differentials to left fingers and arms. However, today patient is asymptomatic.    Left face and left arm tingling Assessment & Plan: Symptoms resolved now. Was seen in the ED on 08/22/24 for this. Currently he is asymptomatic. BP is stable. He is tolerating Rosuvastatin 10 mg daily as started by his neurologist. Encourage tapering off caffeine (now drinking 1 monster drink from 3 drinks per day),  check CMP. Has a h/o ocular migraine. I also recommend patient keeps symptoms journal and review during his follow up visit if symptoms were to reoccur. He will check BP 3 times a week at home as well.  Since then, patient has seen neurologist as well, and is recommended to follow up with them prn.  Reviewed following imaging:  CT angio from  08/22/24 reviewed:  Mild atherosclerosis at  the carotid bifurcations without hemodynamically, significant stenosis.  Minimal atherosclerosis of the left cavernous ICA without stenosis. MRI head, cervical spine 09/07/24 reviewed as well.        Elevated blood pressure reading Assessment & Plan: Evident at ED on 08/22/24. BP within goal today. Patient recommended to check BP at home 2-3 times a week, goal BP <130/80 mmHg. If home BP above goal we will consider lifestyle modifications and pharmacological interventions.      Return in about 4 months (around 01/20/2025) for Chronic follow up .   Luke Shade, MD

## 2024-09-19 NOTE — Assessment & Plan Note (Signed)
 Evident during his ED visit on 08/22/24. This could be contributing to cramping, tinglings. Check CMP, management pending results.

## 2024-09-19 NOTE — Assessment & Plan Note (Signed)
 Based on MRI cervical spine from 09/07/24. Consider this as one of differentials to left fingers and arms. However, today patient is asymptomatic.

## 2024-09-19 NOTE — Assessment & Plan Note (Signed)
 Symptoms resolved now. Was seen in the ED on 08/22/24 for this. Currently he is asymptomatic. BP is stable. He is tolerating Rosuvastatin 10 mg daily as started by his neurologist. Encourage tapering off caffeine (now drinking 1 monster drink from 3 drinks per day),  check CMP. Has a h/o ocular migraine. I also recommend patient keeps symptoms journal and review during his follow up visit if symptoms were to reoccur. He will check BP 3 times a week at home as well.  Since then, patient has seen neurologist as well, and is recommended to follow up with them prn.  Reviewed following imaging:  CT angio from  08/22/24 reviewed:  Mild atherosclerosis at the carotid bifurcations without hemodynamically, significant stenosis.  Minimal atherosclerosis of the left cavernous ICA without stenosis. MRI head, cervical spine 09/07/24 reviewed as well.

## 2025-01-20 ENCOUNTER — Ambulatory Visit
